# Patient Record
Sex: Male | Born: 2005 | Race: Black or African American | Hispanic: No | Marital: Single | Smoking: Never smoker
Health system: Southern US, Community
[De-identification: ages and names within clinical notes are randomized; demographics above are authoritative.]

## PROBLEM LIST (undated history)

## (undated) DIAGNOSIS — M419 Scoliosis, unspecified: Secondary | ICD-10-CM

## (undated) DIAGNOSIS — L309 Dermatitis, unspecified: Secondary | ICD-10-CM

## (undated) DIAGNOSIS — Z91018 Allergy to other foods: Secondary | ICD-10-CM

## (undated) DIAGNOSIS — F419 Anxiety disorder, unspecified: Secondary | ICD-10-CM

## (undated) DIAGNOSIS — F909 Attention-deficit hyperactivity disorder, unspecified type: Secondary | ICD-10-CM

## (undated) DIAGNOSIS — T783XXA Angioneurotic edema, initial encounter: Secondary | ICD-10-CM

## (undated) DIAGNOSIS — F840 Autistic disorder: Secondary | ICD-10-CM

## (undated) DIAGNOSIS — L509 Urticaria, unspecified: Secondary | ICD-10-CM

## (undated) DIAGNOSIS — T7840XA Allergy, unspecified, initial encounter: Secondary | ICD-10-CM

## (undated) DIAGNOSIS — J45909 Unspecified asthma, uncomplicated: Secondary | ICD-10-CM

## (undated) DIAGNOSIS — F429 Obsessive-compulsive disorder, unspecified: Secondary | ICD-10-CM

## (undated) HISTORY — PX: ADENOIDECTOMY: SUR15

## (undated) HISTORY — DX: Angioneurotic edema, initial encounter: T78.3XXA

## (undated) HISTORY — PX: SPINAL FUSION: SHX223

## (undated) HISTORY — DX: Urticaria, unspecified: L50.9

## (undated) HISTORY — DX: Anxiety disorder, unspecified: F41.9

## (undated) HISTORY — DX: Autistic disorder: F84.0

## (undated) HISTORY — DX: Allergy, unspecified, initial encounter: T78.40XA

## (undated) HISTORY — DX: Obsessive-compulsive disorder, unspecified: F42.9

## (undated) HISTORY — DX: Scoliosis, unspecified: M41.9

## (undated) HISTORY — PX: TYMPANOSTOMY TUBE PLACEMENT: SHX32

## (undated) HISTORY — PX: ADENOIDECTOMY: SHX5191

---
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
## 2024-02-06 ENCOUNTER — Other Ambulatory Visit: Payer: Self-pay

## 2024-02-06 ENCOUNTER — Ambulatory Visit (INDEPENDENT_AMBULATORY_CARE_PROVIDER_SITE_OTHER): Payer: MEDICAID | Admitting: Internal Medicine

## 2024-02-06 ENCOUNTER — Encounter: Payer: Self-pay | Admitting: Internal Medicine

## 2024-02-06 VITALS — BP 124/78 | HR 86 | Temp 98.5°F

## 2024-02-06 DIAGNOSIS — L2089 Other atopic dermatitis: Secondary | ICD-10-CM | POA: Diagnosis not present

## 2024-02-06 DIAGNOSIS — H1010 Acute atopic conjunctivitis, unspecified eye: Secondary | ICD-10-CM

## 2024-02-06 DIAGNOSIS — J4540 Moderate persistent asthma, uncomplicated: Secondary | ICD-10-CM

## 2024-02-06 DIAGNOSIS — T7800XD Anaphylactic reaction due to unspecified food, subsequent encounter: Secondary | ICD-10-CM

## 2024-02-06 DIAGNOSIS — J309 Allergic rhinitis, unspecified: Secondary | ICD-10-CM | POA: Diagnosis not present

## 2024-02-06 NOTE — Patient Instructions (Addendum)
 Mild intermittent asthma-- improved   Your spirometry showed inflammation today that is improved from prior     Daily controller medication(s): Continue Symbicort  160mcg 2 puffs twice a day with spacer. Rinse mouth out after. Reviewed proper technique. Spacer given - Prior to physical activity: albuterol  2 puffs 10-15 minutes before physical activity.  - Rescue medications: albuterol  4 puffs every 4-6 hours as needed - Asthma control goals:  * Full participation in all desired activities (may need albuterol  before activity) * Albuterol  use two time or less a week on average (not counting use with activity) * Cough interfering with sleep two time or less a month * Oral steroids no more than once a year * No hospitalizations  Food Allergy  -stable - Continue avoidance of peanut , tree nuts, fish, shellfish and all forms of eggs.  - Have access to Epipen  0.3mg  to be carried at all times.  - Emergency Action Plan given  Allergic rhinitis with conjunctivitis - improved  - Continue Xyzal  5mg  daily  - Continue Dymista  nasal spray 1 spray each nostril twice a day:  take every day to prevent symptoms   Atopic dermatitis-controlled during winter months, worse in summer - Continue daily moisturization with vaseline.   - Continue triamcinolone  for as needed use with flares  Return in about  5 months

## 2024-02-06 NOTE — Progress Notes (Addendum)
 FOLLOW UP Date of Service/Encounter:  02/06/24  Subjective:  Randy Lane (DOB: [birth year]) is a 18 y.o. male who [facility] in re-evaluation of the following: asthma, rhinitis, food allergy , eczema  History obtained from: chart review and patient and mother.  For Review, LV was on 12/26/23  with Dr. Lorin seen for routine follow-up. See below for summary of history and diagnostics.   Therapeutic plans/changes recommended: FEV1 2.25 L (65%), with 23% reversibility, started on Flovent  ; repeat sIgE: still positive to peanut , tree, nut, fish, shellfish, egg   Today presents for follow-up. Discussed the use of AI scribe software for clinical note transcription with the patient, who gave verbal consent to proceed.  History of Present Illness  Randy Lane is an 18 year old male with asthma who presents for follow-up of his respiratory symptoms.  Lower respiratory symptoms - Improvement in cough, wheeze, and shortness of breath - Adheres to Symbicort  regimen: two puffs twice daily, using approximately 26 to 27 doses out of 28 prescribed weekly - No recent need for rescue inhaler - Resumed physical activity with improved exercise tolerance  Upper respiratory symptoms - Persistent nasal congestion and rhinorrhea - Continues Xyzal  and Dymista  regularly for symptom control  Food allergies - Allergies to eggs, peanuts, tree nuts, and shellfish - Strict avoidance of allergenic foods - No recent accidental ingestions or allergic reactions   All medications reviewed by clinical staff and updated in chart. No new pertinent medical or surgical history except as noted in HPI.  ROS: All others negative except as noted per HPI.   Objective:  BP 124/78 (BP Location: Left Arm, Patient Position: Sitting, Cuff Size: Normal)   Pulse 86   Temp 98.5 F (36.9 C) (Temporal)   SpO2 98%  There is no height or weight on file to calculate  BMI. Physical Exam: General Appearance:  Alert, cooperative, no distress, appears stated age  Head:  Normocephalic, without obvious abnormality, atraumatic  Eyes:  Conjunctiva clear, EOM's intact  Ears EACs normal bilaterally  Nose: Nares normal, PINK edematous nasal mucosa with clear rhinorrhea , hypertrophic turbinates, no visible anterior polyps, and septum midline  Throat: Lips, tongue normal; teeth and gums normal, + cobblestoning  Neck: Supple, symmetrical  Lungs:   clear to auscultation bilaterally, Respirations unlabored, no coughing  Heart:  regular rate and rhythm and no murmur, Appears well perfused  Extremities: No edema  Skin: Skin color, texture, turgor normal and no rashes or lesions on visualized portions of skin  Neurologic: No gross deficits   Labs:  Lab Orders  No laboratory test(s) ordered today    Spirometry:  Tracings reviewed. His effort: Good reproducible efforts. FVC: 3.,27L FEV1: 1.97L, 57% predicted FEV1/FVC ratio: 60% Interpretation: moderately severe obstructive pattern . Improved  Please see scanned spirometry results for details.  Assessment/Plan   Patient Instructions  Mild intermittent asthma-- improved   Your spirometry showed inflammation today that is improved from prior     Daily controller medication(s): Continue Symbicort  160mcg 2 puffs twice a day with spacer. Rinse mouth out after. Reviewed proper technique. Spacer given - Prior to physical activity: albuterol  2 puffs 10-15 minutes before physical activity.  - Rescue medications: albuterol  4 puffs every 4-6 hours as needed - Asthma control goals:  * Full participation in all desired activities (may need albuterol  before activity) * Albuterol  use two time or less a week on average (not counting use with activity) * Cough  interfering with sleep two time or less a month * Oral steroids no more than once a year * No hospitalizations  Food Allergy  -stable - Continue avoidance of peanut ,  tree nuts, fish, shellfish and all forms of eggs.  - Have access to Epipen  0.3mg  to be carried at all times.  - Emergency Action Plan given  Allergic rhinitis with conjunctivitis - improved  - Continue Xyzal  5mg  daily  - Continue Dymista  nasal spray 1 spray each nostril twice a day:  take every day to prevent symptoms   Atopic dermatitis-controlled during winter months, worse in summer - Continue daily moisturization with vaseline.   - Continue triamcinolone  for as needed use with flares  Return in about  5 months   Other: reviewed spirometry technique and reviewed inhaler technique  Thank you so much for letting me partake in your care today.  Don't hesitate to reach out if you have any additional concerns!  Hargis Springer, MD  [facility] [place], [place]

## 2024-03-01 ENCOUNTER — Other Ambulatory Visit: Payer: Self-pay | Admitting: Family

## 2024-03-01 NOTE — Telephone Encounter (Signed)
 [place] [facility] called stating the patient is now in a group home and is needing the following medications to be sent in to the pharmacy Dymista ,Triamcinolone ,Levocetirizine,and his Ventolin  inhaler.

## 2024-03-04 ENCOUNTER — Telehealth: Payer: Self-pay

## 2024-03-04 MED ORDER — LEVOCETIRIZINE DIHYDROCHLORIDE 5 MG PO TABS: 5.0000 mg | ORAL_TABLET | Freq: Every evening | ORAL | 5 refills | Status: DC

## 2024-03-04 NOTE — Telephone Encounter (Signed)
 Mom called in stated a refill was needed for levocetirizine. Refill has been sent into [facility]. Verbalized understanding.

## 2024-07-29 ENCOUNTER — Other Ambulatory Visit: Payer: Self-pay | Admitting: Internal Medicine

## 2024-07-30 ENCOUNTER — Ambulatory Visit: Payer: MEDICAID | Admitting: Internal Medicine

## 2024-07-30 ENCOUNTER — Encounter: Payer: Self-pay | Admitting: Internal Medicine

## 2024-07-30 VITALS — BP 122/88 | HR 86 | Temp 98.1°F | Ht 66.0 in | Wt 187.2 lb

## 2024-07-30 DIAGNOSIS — H1010 Acute atopic conjunctivitis, unspecified eye: Secondary | ICD-10-CM | POA: Diagnosis not present

## 2024-07-30 DIAGNOSIS — J4540 Moderate persistent asthma, uncomplicated: Secondary | ICD-10-CM | POA: Diagnosis not present

## 2024-07-30 DIAGNOSIS — T7800XD Anaphylactic reaction due to unspecified food, subsequent encounter: Secondary | ICD-10-CM | POA: Diagnosis not present

## 2024-07-30 DIAGNOSIS — L2089 Other atopic dermatitis: Secondary | ICD-10-CM | POA: Diagnosis not present

## 2024-07-30 DIAGNOSIS — J309 Allergic rhinitis, unspecified: Secondary | ICD-10-CM

## 2024-07-30 MED ORDER — EPINEPHRINE 0.3 MG/0.3ML IJ SOAJ: 0.3000 mg | INTRAMUSCULAR | 2 refills | Status: AC | PRN

## 2024-07-30 MED ORDER — ALBUTEROL SULFATE HFA 108 (90 BASE) MCG/ACT IN AERS: 2.0000 | INHALATION_SPRAY | RESPIRATORY_TRACT | 1 refills | Status: DC | PRN

## 2024-07-30 NOTE — Progress Notes (Unsigned)
 "  FOLLOW UP Date of Service/Encounter:  07/30/2024  Subjective:  Randy Lane (DOB: 06-Mar-2006) is a 19 y.o. male who [facility]24 in re-evaluation of the following: asthma, rhinitis, food allergy , eczema  History obtained from: chart review and patient and mother.  For Review, LV was on 02/06/24   with Dr. Lorin seen for routine follow-up. See below for summary of history and diagnostics.   Therapeutic plans/changes recommended: FEV1: 1.97L, 57% predicted  Today presents for follow-up. Discussed the use of AI scribe software for clinical note transcription with the patient, who gave verbal consent to proceed.  History of Present Illness Randy Lane is a 19 year old male with food allergies who presents for follow-up on his allergy  management.  Food allergies - Allergies to peanut , tree nut, fish, shellfish, and egg - No recent accidental exposures or allergic reactions - Strict avoidance of known allergens  Egg allergy  and oral tolerance - Tolerates small amounts of baked egg (e.g., in brownies or cupcakes) without significant reactions - Within 30 minutes of consuming egg (not baked), experiences stomach discomfort; vomiting may occur within an hour, but not with baked egg - Experienced throat itching after consuming aioli believed to contain egg, without vomiting - Continues to avoid raw egg and egg cooked on the stove (hard boiled, fried, scrambled)  Asthma and respiratory symptoms - Uses Symbicort , two puffs twice daily, with approximately 65% adherence - No recent need for rescue inhaler, emergency room visits, or prednisone  use - Breathing is good without cough, wheeze, or shortness of breath  Allergic rhinitis - Takes Xyzal  (levocetirizine) for allergies - Uses Dymista  nasal spray regularly with good symptom control  Medication adherence and living situation - Recently moved to semi-independent living, resulting in  slight impact on medication adherence - Maintains a consistent routine with inhaler and nasal spray   All medications reviewed by clinical staff and updated in chart. No new pertinent medical or surgical history except as noted in HPI.  ROS: All others negative except as noted per HPI.   Objective:  BP 122/88   Pulse 86   Temp 98.1 F (36.7 C)   Ht 5' 6 (1.676 m)   Wt 187 lb 3.2 oz (84.9 kg)   SpO2 97%   BMI 30.21 kg/m  Body mass index is 30.21 kg/m. Physical Exam: General Appearance:  Alert, cooperative, no distress, appears stated age  Head:  Normocephalic, without obvious abnormality, atraumatic  Eyes:  Conjunctiva clear, EOM's intact  Ears EACs normal bilaterally  Nose: Nares normal, PINK edematous nasal mucosa with clear rhinorrhea , hypertrophic turbinates, no visible anterior polyps, and septum midline  Throat: Lips, tongue normal; teeth and gums normal, + cobblestoning  Neck: Supple, symmetrical  Lungs:   clear to auscultation bilaterally, Respirations unlabored, no coughing  Heart:  regular rate and rhythm and no murmur, Appears well perfused  Extremities: No edema  Skin: Skin color, texture, turgor normal and no rashes or lesions on visualized portions of skin  Neurologic: No gross deficits   Labs:  Lab Orders  No laboratory test(s) ordered today    Spirometry:  Tracings reviewed. His effort: Good reproducible efforts. FVC: 3.,27L FEV1: 1.97L, 57% predicted FEV1/FVC ratio: 60% Interpretation: moderately severe obstructive pattern . Improved  Please see scanned spirometry results for details.  Assessment/Plan   Patient Instructions  Mild intermittent asthma-- improved     Daily controller medication(s): Continue Symbicort  160mcg 2 puffs twice a day  with spacer. Rinse mouth out after. Reviewed proper technique. Spacer given - Prior to physical activity: albuterol  2 puffs 10-15 minutes before physical activity.  - Rescue medications: albuterol  4 puffs  every 4-6 hours as needed - Asthma control goals:  * Full participation in all desired activities (may need albuterol  before activity) * Albuterol  use two time or less a week on average (not counting use with activity) * Cough interfering with sleep two time or less a month * Oral steroids no more than once a year * No hospitalizations  Food Allergy  -stable - Continue avoidance of peanut , tree nuts, fish, shellfish and and straight egg   - okay to eat baked egg  - Have access to Epipen  0.3mg  to be carried at all times.  - Emergency Action Plan given  Allergic rhinitis with conjunctivitis - improved  - Continue Xyzal  5mg  daily  - Continue Dymista  nasal spray 1 spray each nostril twice a day:  take every day to prevent symptoms   Atopic dermatitis-controlled  - Continue daily moisturization with vaseline.   - Continue triamcinolone  for as needed use with flares  Return in about  5 months   Other: reviewed spirometry technique and reviewed inhaler technique  Thank you so much for letting me partake in your care today.  Don't hesitate to reach out if you have any additional concerns!  Hargis Springer, MD  [facility] , [place]        "

## 2024-07-30 NOTE — Patient Instructions (Addendum)
 Persistent asthma-- improved  Breathing tests are still abnormal, but I believe this is technique/effort versus true obstruction     Daily controller medication(s): Continue Symbicort  160mcg 2 puffs twice a day with spacer. Rinse mouth out after. Reviewed proper technique. Spacer given - Prior to physical activity: albuterol  2 puffs 10-15 minutes before physical activity.  - Rescue medications: albuterol  4 puffs every 4-6 hours as needed - Asthma control goals:  * Full participation in all desired activities (may need albuterol  before activity) * Albuterol  use two time or less a week on average (not counting use with activity) * Cough interfering with sleep two time or less a month * Oral steroids no more than once a year * No hospitalizations  Food Allergy  -stable - Continue avoidance of peanut , tree nuts, fish, shellfish and and straight egg   - okay to eat baked egg  - Have access to Epipen  0.3mg  to be carried at all times.  - Emergency Action Plan given  Allergic rhinitis with conjunctivitis - improved  - Continue Xyzal  5mg  daily  - Continue Dymista  nasal spray 1 spray each nostril twice a day:  take every day to prevent symptoms   Atopic dermatitis-controlled  - Continue daily moisturization with vaseline.   - Continue triamcinolone  for as needed use with flares  Return in about  5 months

## 2024-08-07 ENCOUNTER — Other Ambulatory Visit: Payer: Self-pay

## 2024-08-07 ENCOUNTER — Ambulatory Visit (HOSPITAL_COMMUNITY)
Admission: EM | Admit: 2024-08-07 | Discharge: 2024-08-08 | Disposition: A | Discharge status: Other Acute Inpatient Hospital | Payer: MEDICAID | Attending: Family Medicine | Admitting: Family Medicine

## 2024-08-07 DIAGNOSIS — R45851 Suicidal ideations: Secondary | ICD-10-CM | POA: Insufficient documentation

## 2024-08-07 DIAGNOSIS — R9431 Abnormal electrocardiogram [ECG] [EKG]: Secondary | ICD-10-CM | POA: Diagnosis not present

## 2024-08-07 DIAGNOSIS — F332 Major depressive disorder, recurrent severe without psychotic features: Secondary | ICD-10-CM | POA: Diagnosis not present

## 2024-08-07 LAB — COMPREHENSIVE METABOLIC PANEL WITH GFR
ALT: 25 U/L (ref 0–44)
AST: 22 U/L (ref 15–41)
Albumin: 4.7 g/dL (ref 3.5–5.0)
Alkaline Phosphatase: 113 U/L (ref 38–126)
Anion gap: 14 (ref 5–15)
BUN: 6 mg/dL (ref 6–20)
CO2: 26 mmol/L (ref 22–32)
Calcium: 9.7 mg/dL (ref 8.9–10.3)
Chloride: 103 mmol/L (ref 98–111)
Creatinine, Ser: 1.1 mg/dL (ref 0.61–1.24)
GFR, Estimated: >60 mL/min
Glucose, Bld: 107 mg/dL — ABNORMAL HIGH (ref 70–99)
Potassium: 3.6 mmol/L (ref 3.5–5.1)
Sodium: 143 mmol/L (ref 135–145)
Total Bilirubin: 0.6 mg/dL (ref 0.0–1.2)
Total Protein: 7.9 g/dL (ref 6.5–8.1)

## 2024-08-07 LAB — CBC WITH DIFFERENTIAL/PLATELET
Abs Immature Granulocytes: 0.07 K/uL (ref 0.00–0.07)
Basophils Absolute: 0.1 K/uL (ref 0.0–0.1)
Basophils Relative: 1 %
Eosinophils Absolute: 0.3 K/uL (ref 0.0–0.5)
Eosinophils Relative: 4 %
HCT: 43.5 % (ref 39.0–52.0)
Hemoglobin: 14.2 g/dL (ref 13.0–17.0)
Immature Granulocytes: 1 %
Lymphocytes Relative: 30 %
Lymphs Abs: 2.6 K/uL (ref 0.7–4.0)
MCH: 30.3 pg (ref 26.0–34.0)
MCHC: 32.6 g/dL (ref 30.0–36.0)
MCV: 92.8 fL (ref 80.0–100.0)
Monocytes Absolute: 0.6 K/uL (ref 0.1–1.0)
Monocytes Relative: 6 %
Neutro Abs: 5.3 K/uL (ref 1.7–7.7)
Neutrophils Relative %: 58 %
Platelets: 444 K/uL — ABNORMAL HIGH (ref 150–400)
RBC: 4.69 MIL/uL (ref 4.22–5.81)
RDW: 12.2 % (ref 11.5–15.5)
WBC: 8.9 K/uL (ref 4.0–10.5)
nRBC: 0 % (ref 0.0–0.2)

## 2024-08-07 LAB — URINALYSIS, ROUTINE W REFLEX MICROSCOPIC
Bilirubin Urine: NEGATIVE
Glucose, UA: 50 mg/dL — AB
Hgb urine dipstick: NEGATIVE
Ketones, ur: NEGATIVE mg/dL
Leukocytes,Ua: NEGATIVE
Nitrite: NEGATIVE
Protein, ur: NEGATIVE mg/dL
Specific Gravity, Urine: 1.016 (ref 1.005–1.030)
pH: 7 (ref 5.0–8.0)

## 2024-08-07 LAB — POCT URINE DRUG SCREEN - MANUAL ENTRY (I-SCREEN)
POC Amphetamine UR: NOT DETECTED
POC Buprenorphine (BUP): NOT DETECTED
POC Cocaine UR: NOT DETECTED
POC Marijuana UR: NOT DETECTED
POC Methadone UR: NOT DETECTED
POC Methamphetamine UR: NOT DETECTED
POC Morphine: NOT DETECTED
POC Oxazepam (BZO): NOT DETECTED
POC Oxycodone UR: NOT DETECTED
POC Secobarbital (BAR): NOT DETECTED

## 2024-08-07 LAB — ETHANOL: Alcohol, Ethyl (B): <15 mg/dL

## 2024-08-07 LAB — SALICYLATE LEVEL: Salicylate Lvl: <7 mg/dL — ABNORMAL LOW (ref 7.0–30.0)

## 2024-08-07 LAB — TSH: TSH: 0.876 u[IU]/mL (ref 0.350–4.500)

## 2024-08-07 LAB — VITAMIN D 25 HYDROXY (VIT D DEFICIENCY, FRACTURES): Vit D, 25-Hydroxy: 12.7 ng/mL — ABNORMAL LOW (ref 30–100)

## 2024-08-07 LAB — ACETAMINOPHEN LEVEL: Acetaminophen (Tylenol), Serum: <10 ug/mL — ABNORMAL LOW (ref 10–30)

## 2024-08-07 MED ORDER — LORAZEPAM 2 MG/ML IJ SOLN: 2.0000 mg | Freq: Three times a day (TID) | INTRAMUSCULAR | Status: DC | PRN

## 2024-08-07 MED ORDER — CLONIDINE HCL 0.1 MG PO TABS
0.1000 mg | ORAL_TABLET | Freq: Two times a day (BID) | ORAL | Status: DC
  Administered 2024-08-07 – 2024-08-08 (×2): 0.1 mg via ORAL
  Filled 2024-08-07 (×2): qty 1

## 2024-08-07 MED ORDER — ACETAMINOPHEN 325 MG PO TABS: 650.0000 mg | ORAL_TABLET | Freq: Four times a day (QID) | ORAL | Status: DC | PRN

## 2024-08-07 MED ORDER — HALOPERIDOL LACTATE 5 MG/ML IJ SOLN: 5.0000 mg | Freq: Three times a day (TID) | INTRAMUSCULAR | Status: DC | PRN

## 2024-08-07 MED ORDER — DIPHENHYDRAMINE HCL 50 MG/ML IJ SOLN: 50.0000 mg | Freq: Three times a day (TID) | INTRAMUSCULAR | Status: DC | PRN

## 2024-08-07 MED ORDER — MAGNESIUM HYDROXIDE 400 MG/5ML PO SUSP: 30.0000 mL | Freq: Every day | ORAL | Status: DC | PRN

## 2024-08-07 MED ORDER — DIPHENHYDRAMINE HCL 50 MG PO CAPS: 50.0000 mg | ORAL_CAPSULE | Freq: Three times a day (TID) | ORAL | Status: DC | PRN

## 2024-08-07 MED ORDER — HALOPERIDOL 5 MG PO TABS: 5.0000 mg | ORAL_TABLET | Freq: Three times a day (TID) | ORAL | Status: DC | PRN

## 2024-08-07 MED ORDER — ALUM & MAG HYDROXIDE-SIMETH 200-200-20 MG/5ML PO SUSP: 30.0000 mL | ORAL | Status: DC | PRN

## 2024-08-07 MED ORDER — FLUVOXAMINE MALEATE 50 MG PO TABS
150.0000 mg | ORAL_TABLET | Freq: Two times a day (BID) | ORAL | Status: DC
  Administered 2024-08-07 – 2024-08-08 (×2): 150 mg via ORAL
  Filled 2024-08-07 (×3): qty 3

## 2024-08-07 MED ORDER — HALOPERIDOL LACTATE 5 MG/ML IJ SOLN: 10.0000 mg | Freq: Three times a day (TID) | INTRAMUSCULAR | Status: DC | PRN

## 2024-08-07 MED ORDER — DEXMETHYLPHENIDATE HCL ER 5 MG PO CP24
25.0000 mg | ORAL_CAPSULE | Freq: Every day | ORAL | Status: DC
  Administered 2024-08-08: 25 mg via ORAL
  Filled 2024-08-07: qty 5

## 2024-08-07 NOTE — BH Assessment (Addendum)
 Comprehensive Clinical Assessment (CCA) Note  08/08/2024 Randy Lane [id number]  Chief Complaint:  Chief Complaint  Patient presents with   Suicidal Thoughts    Visit Diagnosis:   Severe episode of recurrent major depressive disorder, without psychotic features (HCC)  Suicidal ideation   Disposition: Per Randy Culver, NP patient is recommended for inpatient admission.   Disposition SW to pursue appropriate inpatient options.  The patient demonstrates the following risk factors for suicide: Chronic risk factors for suicide include: previous suicide attempts attempted to drown self in bathtub and attempt to walk into traffic within the last week and previous self-harm via cutting. Acute risk factors for suicide include: Break up from relationship. Protective factors for this patient include: positive social support and hope for the future. Considering these factors, the overall suicide risk at this point appears to be moderate. Patient is not appropriate for outpatient follow up.   Patient is a 19 year old male with a history of anxiety disorder who presents voluntarily to [facility] for an assessment. Patient resides in a Group home and identifies his mother and his ex's mother as their primary support system.Patient reports isolation, crying spells, irritability, hopelessness, guilt, loss of interest to do things they enjoy, fatigue, lack of concentration, and worthlessness. Patient reports history of past suicide attempts, last occurrence was earlier this week.  Patient does not report a hx of Substance Abuse:  Patient reports NSSIB, SI, and denied HI, AVH.  Patient identifies his primary stressors as relationship. Patient denies history of abuse or trauma. Patient denies current legal problems. Patient is not receiving outpatient therapy, however does receive psychiatry services, [facility]. Patient reports he  takes his medications as  prescribed (see MAR) and denies recent medication changes. Patient reports previous inpatient admission at [facility] for suicidal ideation in April.  Patient denies access to weapons.   Pt is dressed in scrubs, alert, oriented x 5 with normal speech and restless motor behavior. Eye contact is fair . Pts mood is depressed and affect is anxious. Thought process is coherent and relevant. Pts insight is good and judgement is fair. There is no indication Pt is currently responding to internal stimuli or experiencing delusional thought content. Pt was cooperative throughout assessment.   Patient is unable to contract for safety outside of the hospital.    Treatment options were discussed and patient is in agreement with recommendation for Inpatient Hospitalization.    CCA Screening, Triage and Referral (STR)  Patient Reported Information How did you hear about us ? Legal System  What Is the Reason for Your Visit/Call Today? Randy Lane is a 19 year old male brought in by [employer] to the [facility] and is voluntary and unaccompanied. Pt endorsing SI. Reports having 2 recent suicide attempts this week. Pt reports attempting to drown self in the bathtub as well as walking to the railroad tracks with intent to run in front of a train. Pt denied AVH. Pt reports recently going through a breakup which has caused him to feel depressed. Pt acknowledges feelings of irritability, decreased energy, difficulty concentrating, hopelessness, worthlessness, blame/guilt. Pt HI, history of violence and access to weapons. Pt also denied alcohol/substance use. Pt reports hx of GAD, ADD and Autism.  How Long Has This Been Causing You Problems? 1 wk - 1 month  What Do You Feel Would Help You the Most Today? Treatment for Depression or other mood problem; Stress Management; Medication(s); Social Support   Have You Recently Had  Any Thoughts About Hurting Yourself? Yes  Are You Planning to Commit Suicide/Harm Yourself At This time?  No   Flowsheet Row ED from 08/07/2024 in [facility] Most recent reading at 08/07/2024  8:20 PM ED from 11/08/2023 in [facility] Most recent reading at 11/08/2023 12:22 PM ED from 11/08/2023 in [facility] Most recent reading at 11/08/2023  2:29 AM  C-SSRS RISK CATEGORY Moderate Risk Low Risk Low Risk    Have you Recently Had Thoughts About Hurting Someone Randy Lane? No  Are You Planning to Harm Someone at This Time? No  Explanation: Pt endorsing SI, denied HI.   Have You Used Any Alcohol or Drugs in the Past 24 Hours? No  How Long Ago Did You Use Drugs or Alcohol? None reported.  What Did You Use and How Much? None reported.   Do You Currently Have a Therapist/Psychiatrist? Yes  Name of Therapist/Psychiatrist: Name of Therapist/Psychiatrist: Pt [facility]. Pt does not have a therapist.   Have You Been Recently Discharged From Any Office Practice or Programs? No  Explanation of Discharge From Practice/Program: No data recorded    CCA Screening Triage Referral Assessment Type of Contact: Face-to-Face  Telemedicine Service Delivery:   Is this Initial or Reassessment?   Date Telepsych consult ordered in [facility]:    Time Telepsych consult ordered in [facility]:    Location of Assessment: [facility] Services  Provider Location: [facility] Assessment Services   Collateral Involvement: None.   Does Patient Have a Automotive Engineer Guardian? No  Legal Guardian Contact Information: n/a  Copy of Legal Guardianship Form: -- (none)  Legal Guardian Notified of Arrival: -- (n/a)  Legal Guardian Notified of Pending Discharge: -- (n/a)  If Minor and Not Living with Parent(s), Who has Custody? n/a  Is CPS involved or ever been involved? Never  Is APS involved or ever been involved? Never   Patient Determined To Be At Risk for Harm To  Self or Others Based on Review of Patient Reported Information or Presenting Complaint? Yes, for Self-Harm  Method: No Plan  Availability of Means: No access or NA  Intent: Vague intent or NA  Notification Required: No need or identified person  Additional Information for Danger to Others Potential: -- (n/a)  Additional Comments for Danger to Others Potential: NA  Are There Guns or Other Weapons in Your Home? No  Types of Guns/Weapons: Pt denies.  Are These Weapons Safely Secured?                            -- (pt denied)  Who Could Verify You Are Able To Have These Secured: NA  Do You Have any Outstanding Charges, Pending Court Dates, Parole/Probation? pt denied legal involvement.  Contacted To Inform of Risk of Harm To Self or Others: -- (n/a)    Does Patient Present under Involuntary Commitment? No    [place] of Residence: [name]   Patient Currently Receiving the Following Services: Medication Management   Determination of Need: Urgent (48 hours)   Options For Referral: Inpatient Hospitalization; Intensive Outpatient Therapy; Outpatient Therapy     CCA Biopsychosocial Patient Reported Schizophrenia/Schizoaffective Diagnosis in Past: No   Strengths: Pt is insightful.   Mental Health Symptoms Depression:  Difficulty Concentrating; Hopelessness; Worthlessness; Irritability; Sleep (too much or little); Tearfulness; Fatigue   Duration of Depressive  symptoms: Duration of Depressive Symptoms: Greater than two weeks   Mania:  None   Anxiety:   Worrying; Restlessness; Irritability; Difficulty concentrating; Fatigue   Psychosis:  None   Duration of Psychotic symptoms:    Trauma:  None   Obsessions:  None   Compulsions:  None   Inattention:  None   Hyperactivity/Impulsivity:  Feeling of restlessness   Oppositional/Defiant Behaviors:  None   Emotional Irregularity:  Recurrent suicidal behaviors/gestures/threats   Other Mood/Personality Symptoms:   NA    Mental Status Exam Appearance and self-care  Stature:  Average   Weight:  Average weight   Clothing:  Casual   Grooming:  Normal   Cosmetic use:  None   Posture/gait:  Normal   Motor activity:  Not Remarkable   Sensorium  Attention:  Normal   Concentration:  Normal   Orientation:  X5   Recall/memory:  Defective in Immediate   Affect and Mood  Affect:  Congruent   Mood:  Depressed   Relating  Eye contact:  Normal   Facial expression:  Depressed   Attitude toward examiner:  Cooperative   Thought and Language  Speech flow: Normal   Thought content:  Appropriate to Mood and Circumstances   Preoccupation:  None   Hallucinations:  None   Organization:  Coherent; Goal-directed; Intact   [employer] of Knowledge:  Fair   Intelligence:  Average   Abstraction:  Normal   Judgement:  Fair   Dance Movement Psychotherapist:  Adequate   Insight:  Fair   Decision Making:  Normal   Social Functioning  Social Maturity:  Impulsive   Social Judgement:  Normal   Stress  Stressors:  Relationship   Coping Ability:  Overwhelmed; Exhausted   Skill Deficits:  None   Supports:  Family     Religion: Religion/Spirituality Are You A Religious Person?: No How Might This Affect Treatment?: NA  Leisure/Recreation:    Exercise/Diet: Exercise/Diet Do You Exercise?: Yes What Type of Exercise Do You Do?: Run/Walk How Many Times a Week Do You Exercise?: 1-3 times a week Have You Gained or Lost A Significant Amount of Weight in the Past Six Months?: No Do You Follow a Special Diet?: Yes Type of Diet: Pt reports he's allergic to peanut , shellfish, fish and eggs. Do You Have Any Trouble Sleeping?: No   CCA Employment/Education Employment/Work Situation: Employment / Work Situation Employment Situation: Employed Work Stressors: None reported. Patient's Job has Been Impacted by Current Illness: No Has Patient ever Been in the [employer]?:  No  Education: Education Is Patient Currently Attending School?: No Last Grade Completed: 12 Did You Attend College?: No Did You Have An Individualized Education Program (IIEP): No Did You Have Any Difficulty At School?: No Patient's Education Has Been Impacted by Current Illness: No   CCA Family/Childhood History Family and Relationship History:    Childhood History:  Childhood History By whom was/is the patient raised?: Mother Did patient suffer any verbal/emotional/physical/sexual abuse as a child?: No Did patient suffer from severe childhood neglect?: No Has patient ever been sexually abused/assaulted/raped as an adolescent or adult?: No Was the patient ever a victim of a crime or a disaster?: No Witnessed domestic violence?: No Has patient been affected by domestic violence as an adult?: No       CCA Substance Use Alcohol/Drug Use: Alcohol / Drug Use Pain Medications: See MAR Prescriptions: See MAR Over the Counter: See MAR History of alcohol / drug use?: No history of alcohol /  drug abuse Longest period of sobriety (when/how long): NA Negative Consequences of Use:  (n/a) Withdrawal Symptoms: None                         ASAM's:  Six Dimensions of Multidimensional Assessment  Dimension 1:  Acute Intoxication and/or Withdrawal Potential:      Dimension 2:  Biomedical Conditions and Complications:      Dimension 3:  Emotional, Behavioral, or Cognitive Conditions and Complications:     Dimension 4:  Readiness to Change:     Dimension 5:  Relapse, Continued use, or Continued Problem Potential:     Dimension 6:  Recovery/Living Environment:     ASAM Severity Score:    ASAM Recommended Level of Treatment:     Substance use Disorder (SUD)    Recommendations for Services/Supports/Treatments:    Disposition Recommendation per psychiatric provider: We recommend inpatient psychiatric hospitalization when medically cleared. Patient is under voluntary  admission status at this time; please IVC if attempts to leave hospital.   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

## 2024-08-07 NOTE — Progress Notes (Signed)
" °   08/07/24 1658  [facility] Triage Screening (Walk-ins at [facility] only)  How Did You Hear About Us ? Legal System  What Is the Reason for Your Visit/Call Today? Randy Lane is a 19 year old male presenting to [facility] escorted by [employer]. PT states he recently experienced a break up and it endorsed suicidal thoughts and the works of a possible plan to end his life. Pt states he had two recent attempts of ending his life by trying to drown himself and stand infront of a railroad track within the past week. Pt states he also cut his arm a week ago. Pt has thought of 4 suicide plans in the past few years. Pt states he currently does not have a plan to end his life, but has severe suicidal thoughts. PT states he has Autism, MDD and GAD. He is currently taking Prozac  and Sertraline as prescribed. Pt denies seeing a therapist at this time. Pt also denies substance use, Hi, NSSIB and AVH at this time.  How Long Has This Been Causing You Problems? <Week  Have You Recently Had Any Thoughts About Hurting Yourself? Yes  How long ago did you have thoughts about hurting yourself? today  Are You Planning to Commit Suicide/Harm Yourself At This time? No  Have you Recently Had Thoughts About Hurting Someone [name]? No  Are You Planning To Harm Someone At This Time? No  Exploitation of patient/patient's resources Denies  Self-Neglect Denies  Possible abuse reported to: Other (Comment)  Are you currently experiencing any auditory, visual or other hallucinations? No  Have You Used Any Alcohol or Drugs in the Past 24 Hours? No  Do you have any current medical co-morbidities that require immediate attention? No  What Do You Feel Would Help You the Most Today? Treatment for Depression or other mood problem;Stress Management  If access to [facility] was not available, would you have sought care in the Emergency Department? No  Determination of Need Urgent (48 hours)  Options For Referral Inpatient Hospitalization;Intensive Outpatient  Therapy  Determination of Need filed? Yes    "

## 2024-08-07 NOTE — ED Provider Notes (Signed)
 [facility] Urgent Care Continuous Assessment Admission H&P  Date: 08/07/24 Patient Name: Randy Lane MRN: [id number] Chief Complaint: Break up   Diagnoses:  Final diagnoses:  Severe episode of recurrent major depressive disorder, without psychotic features (HCC)  Suicidal ideation    HPI: Per triage, Adkison is a 19 year old male presenting to [facility] escorted by [employer]. PT states he recently experienced a break up and it endorsed suicidal thoughts and the works of a possible plan to end his life. Pt states he had two recent attempts of ending his life by trying to drown himself and stand infront of a railroad track within the past week. Pt states he also cut his arm a week ago. Pt has thought of 4 suicide plans in the past few years. Pt states he currently does not have a plan to end his life, but has severe suicidal thoughts. PT states he has Autism, MDD and GAD. He is currently taking Prozac  and Sertraline as prescribed. Pt denies seeing a therapist at this time. Pt also denies substance use, Hi, NSSIB and AVH at this time.   Pt is seen face-to-face on the [facility] Adult treatment area. Pt is alert & oriented x 4 and engages in today's visit. Pt is seated in a chair at the table, holding his head. Today, he states he came in for evaluation due to a break up. States he and his partner of 3 months broke up 2-3 weeks ago. States breakup was not mutual and was initiated by partner and has experienced significant distress following the breakup. He reports feeling suicidal since the breakup occurred. He describes multiple recents suicidal plans. He reports that one week ago (last Saturday), he attempted to drown himself in a bathtub, states earlier this week he walked to the train tracks with the intention of jumping in front of a train but somebody talked me out of it. He states today he called the police from his group home because I really wanted them to shoot me. He states he called out of work today which  the first time since the break up. States he is currently residing in a [facility] group home and has been there a few months; states this is going alright. States appetite and sleep are pretty good. He denies homicidal ideation, intent or plan. Denies AVH. The patient presents with symptoms consistent with depressive episode, including persistent low mood, anhedonia, fatigue, impaired concentration, and significant feelings of hopelessness. The patient endorses suicidal ideation and voices different plans to end his life. He asks is there a shot that I can take that will kill me? Inpatient hospitalization is recommended at this time.   Pt gave verbal consent to contact his mother to obtain phone number for group home and to advise group home that he would not be at the group home this evening. [facility] [phone] advised that patient would not be at the group home this evening due to hospitalization.   Total Time spent with patient: 20 minutes  Musculoskeletal  Strength & Muscle Tone: within normal limits Gait & Station: normal Patient leans: N/A  Psychiatric Specialty Exam  Presentation General Appearance:  Fairly Groomed; Appropriate for Environment  Eye Contact: Poor  Speech: Clear and Coherent; Slow  Speech Volume: Decreased  Handedness: Right   Mood and Affect  Mood: Hopeless; Depressed  Affect: Depressed; Congruent   Thought Process  Thought Processes: Goal Directed  Descriptions of Associations:Intact  Orientation:Full (Time, Place and Person)  Thought  Content:Logical  Diagnosis of Schizophrenia or Schizoaffective disorder in past: No   Hallucinations:Hallucinations: None  Ideas of Reference:None  Suicidal Thoughts:Suicidal Thoughts: Yes, Active SI Active Intent and/or Plan: With Plan  Homicidal Thoughts:Homicidal Thoughts: No   Sensorium  Memory: Immediate Good; Recent Good  Judgment: Poor  Insight: Poor   Executive  Functions  Concentration: Fair  Attention Span: Fair  Recall: Good  Fund of Knowledge: Good  Language: Good   Psychomotor Activity  Psychomotor Activity:Psychomotor Activity: Decreased   Assets  Assets: Communication Skills; Desire for Improvement; Housing; Vocational/Educational   Sleep  Sleep:Sleep: Fair   Nutritional Assessment (For OBS and FBC admissions only) Has the patient had a weight loss or gain of 10 pounds or more in the last 3 months?: No Has the patient had a decrease in food intake/or appetite?: No Does the patient have dental problems?: No Does the patient have eating habits or behaviors that may be indicators of an eating disorder including binging or inducing vomiting?: No Has the patient recently lost weight without trying?: 0 Has the patient been eating poorly because of a decreased appetite?: 0 Malnutrition Screening Tool Score: 0    Physical Exam Vitals and nursing note reviewed.  HENT:     Head: Normocephalic.     Mouth/Throat:     Mouth: Mucous membranes are moist.  Cardiovascular:     Rate and Rhythm: Normal rate.  Pulmonary:     Effort: Pulmonary effort is normal.  Skin:    General: Skin is warm and dry.  Neurological:     Mental Status: He is alert and oriented to person, place, and time.  Psychiatric:     Comments: See HPI    Review of Systems  Constitutional:  Negative for chills and fever.  HENT:  Negative for congestion and sore throat.   Respiratory:  Negative for cough and shortness of breath.   Cardiovascular:  Negative for chest pain and palpitations.  Gastrointestinal:  Negative for diarrhea, nausea and vomiting.  Psychiatric/Behavioral:  Positive for depression and suicidal ideas. Negative for hallucinations and substance abuse.     Blood pressure (!) 138/118, pulse 72, temperature 97.8 F (36.6 C), temperature source Oral, resp. rate 19, SpO2 99%. There is no height or weight on file to calculate BMI.  Past  Psychiatric History: Autism, ADHD, Depression, Anxiety Hospitalizations: [facility] (Apr, 2025), [facility] (March, 2023, Nov, 2022  Is the patient at risk to self? Yes  Has the patient been a risk to self in the past 6 months? Yes .    Has the patient been a risk to self within the distant past? Yes  - self-inflicted lacerations, suicidal ideation and intentional self-harm in 2023 Is the patient a risk to others? No   Has the patient been a risk to others in the past 6 months? unknown Has the patient been a risk to others within the distant past? unknown  Past Medical History: Asthma, Food allergy , Scoliosis, Urticaria   Family History: Maternal cousin: ADD  Social History: High school graduate. Single, lives in group home [facility] [address]) for the past few months. 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 Denies substance use.   Last Labs:  No visits with results within 6 Month(s) from this visit.  Latest known visit with results is:  Admission on 11/08/2023, Discharged on 11/12/2023  Component Date Value Ref Range Status   HCV Ab 11/10/2023 NON REACTIVE  NON REACTIVE Final   Comment: (NOTE) Nonreactive HCV  antibody screen is consistent with no HCV infections,  unless recent infection is suspected or other evidence exists to indicate HCV infection.  Performed at [facility], [address], [place], [place] [address]    Magnesium  11/10/2023 2.3  1.7 - 2.4 mg/dL Final   Performed at [facility], [address], [place], [place] [address]   Vit D, 25-Hydroxy 11/10/2023 13.59 (L)  30 - 100 ng/mL Final   Comment: (NOTE) Vitamin D  deficiency has been defined by the Institute of Medicine  and an Endocrine Society practice guideline as a level of serum 25-OH  vitamin D  less than 20 ng/mL (1,2). The Endocrine Society went on to  further define vitamin D  insufficiency as a level between 21 and 29  ng/mL (2).  1. IOM (Institute of Medicine). 2010. Dietary  reference intakes for  calcium and D. [place]: [employer]. 2. Holick MF, [place], Bischoff-Ferrari HA, et al. Evaluation,  treatment, and prevention of vitamin D  deficiency: an Endocrine  Society clinical practice guideline, JCEM. 2011 Jul; 96(7): 1911-30.  Performed at [facility], [address], [place], [place] [address]    Vitamin B-12 11/10/2023 321  180 - 914 pg/mL Final   Comment: (NOTE) This assay is not validated for testing neonatal or myeloproliferative syndrome specimens for Vitamin B12 levels. Performed at [facility], [address] [place], [place] [address]    RPR Ser Ql 11/10/2023 NON REACTIVE  NON REACTIVE Final   Performed at [facility], [address], [place], [place] [address]   HIV Screen 4th Generation wRfx 11/10/2023 Non Reactive  Non Reactive Final   Performed at [facility], [address], [place], [place] [address]    Allergies: Peanut  oil, Fish allergy , Dust mite extract, Egg protein (egg white), Egg protein-containing drug products, Other, and Shellfish allergy   Medications:  Facility Ordered Medications  Medication   acetaminophen  (TYLENOL ) tablet 650 mg   alum & mag hydroxide-simeth (MAALOX/MYLANTA) 200-200-20 MG/5ML suspension 30 mL   magnesium  hydroxide (MILK OF MAGNESIA) suspension 30 mL   haloperidol  (HALDOL ) tablet 5 mg   And   diphenhydrAMINE  (BENADRYL ) capsule 50 mg   haloperidol  lactate (HALDOL ) injection 5 mg   And   diphenhydrAMINE  (BENADRYL ) injection 50 mg   And   LORazepam  (ATIVAN ) injection 2 mg   haloperidol  lactate (HALDOL ) injection 10 mg   And   diphenhydrAMINE  (BENADRYL ) injection 50 mg   And   LORazepam  (ATIVAN ) injection 2 mg   cloNIDine  (CATAPRES ) tablet 0.1 mg   [START ON 08/08/2024] dexmethylphenidate  (FOCALIN  XR) 24 hr capsule 25 mg   fluvoxaMINE  (LUVOX ) tablet 150 mg   PTA Medications  Medication Sig   dexmethylphenidate  40 MG CP24 Take 1 capsule (40 mg  total) by mouth daily after breakfast.   fluvoxaMINE  (LUVOX ) 50 MG tablet Take 3 tablets (150 mg total) by mouth 2 (two) times daily.   hydrOXYzine  (ATARAX ) 25 MG tablet Take 1 tablet (25 mg total) by mouth 3 (three) times daily as needed for anxiety.   mirtazapine  (REMERON ) 7.5 MG tablet Take 1 tablet (7.5 mg total) by mouth at bedtime as needed (sleep).   melatonin 5 MG TABS Take 1 tablet (5 mg total) by mouth at bedtime.   azelastine  (ASTELIN ) 0.1 % nasal spray Place 1 spray into both nostrils 2 (two) times daily. Use in each nostril as directed   loratadine  (CLARITIN ) 10 MG tablet Take 1  tablet (10 mg total) by mouth every evening.   budesonide -formoterol  (SYMBICORT ) 160-4.5 MCG/ACT inhaler Inhale 2 puffs into the lungs 2 (two) times daily.   cloNIDine  (CATAPRES ) 0.1 MG tablet Take 0.1 mg by mouth 2 (two) times daily.   levocetirizine (XYZAL ) 5 MG tablet TAKE ONE TABLET BY MOUTH EVERY EVENING   Azelastine -Fluticasone  137-50 MCG/ACT SUSP Place into the nose.   albuterol  (VENTOLIN  HFA) 108 (90 Base) MCG/ACT inhaler Inhale 2 puffs into the lungs every 4 (four) hours as needed for wheezing or shortness of breath.   EPINEPHrine  0.3 mg/0.3 mL IJ SOAJ injection Inject 0.3 mg into the muscle as needed for anaphylaxis.      Medical Decision Making   Lab Orders         CBC with Differential/Platelet         Comprehensive metabolic panel         Ethanol         TSH         Urinalysis, Routine w reflex microscopic -Urine, Clean Catch         VITAMIN D  25 Hydroxy (Vit-D Deficiency, Fractures)         POCT Urine Drug Screen - (I-Screen)     EKG  Agitation protocol  Home medications continued: Clonidine  0.1 mg PO BID Dexmethylphenidate  ER 25 mg PO daily Fluvoxamine  150 mg PO BID   Recommendations  Based on my evaluation the patient does not appear to have an emergency medical condition.  Inpatient hospitalization is recommended.   Sherrell Culver, PMHNP-BC, FNP-BC  08/07/24  6:50 PM

## 2024-08-07 NOTE — ED Notes (Signed)
 Pt denies SI/HI/AVH. No reports of pain. Compliant with scheduled meds. He was calm, cooperative, depressed, and pleasant. No behavioral concerns at this time. Pt sleeping in bed, no acute distress noted. Respirations even and unlabored. Continue to monitor for safety.

## 2024-08-08 ENCOUNTER — Other Ambulatory Visit: Payer: Self-pay

## 2024-08-08 ENCOUNTER — Encounter (HOSPITAL_COMMUNITY): Payer: Self-pay | Admitting: Family Medicine

## 2024-08-08 ENCOUNTER — Inpatient Hospital Stay (HOSPITAL_COMMUNITY)
Admission: AD | Admit: 2024-08-08 | Discharge: 2024-08-13 | DRG: 885 | Disposition: A | Discharge status: Home / Self Care | Payer: MEDICAID | Source: Intra-hospital

## 2024-08-08 DIAGNOSIS — R45851 Suicidal ideations: Secondary | ICD-10-CM | POA: Diagnosis not present

## 2024-08-08 DIAGNOSIS — J45909 Unspecified asthma, uncomplicated: Secondary | ICD-10-CM | POA: Diagnosis present

## 2024-08-08 DIAGNOSIS — Z981 Arthrodesis status: Secondary | ICD-10-CM

## 2024-08-08 DIAGNOSIS — Z79899 Other long term (current) drug therapy: Secondary | ICD-10-CM

## 2024-08-08 DIAGNOSIS — Z7951 Long term (current) use of inhaled steroids: Secondary | ICD-10-CM

## 2024-08-08 DIAGNOSIS — E559 Vitamin D deficiency, unspecified: Secondary | ICD-10-CM | POA: Diagnosis present

## 2024-08-08 DIAGNOSIS — Z8249 Family history of ischemic heart disease and other diseases of the circulatory system: Secondary | ICD-10-CM

## 2024-08-08 DIAGNOSIS — F332 Major depressive disorder, recurrent severe without psychotic features: Principal | ICD-10-CM | POA: Diagnosis present

## 2024-08-08 DIAGNOSIS — Z823 Family history of stroke: Secondary | ICD-10-CM | POA: Diagnosis not present

## 2024-08-08 DIAGNOSIS — Z9151 Personal history of suicidal behavior: Secondary | ICD-10-CM

## 2024-08-08 DIAGNOSIS — F909 Attention-deficit hyperactivity disorder, unspecified type: Secondary | ICD-10-CM | POA: Diagnosis present

## 2024-08-08 DIAGNOSIS — G4700 Insomnia, unspecified: Secondary | ICD-10-CM | POA: Diagnosis present

## 2024-08-08 DIAGNOSIS — Z833 Family history of diabetes mellitus: Secondary | ICD-10-CM

## 2024-08-08 DIAGNOSIS — F411 Generalized anxiety disorder: Secondary | ICD-10-CM | POA: Diagnosis present

## 2024-08-08 DIAGNOSIS — F429 Obsessive-compulsive disorder, unspecified: Secondary | ICD-10-CM | POA: Diagnosis present

## 2024-08-08 MED ORDER — MAGNESIUM HYDROXIDE 400 MG/5ML PO SUSP: 30.0000 mL | Freq: Every day | ORAL | Status: DC | PRN

## 2024-08-08 MED ORDER — FLUVOXAMINE MALEATE 50 MG PO TABS
150.0000 mg | ORAL_TABLET | Freq: Two times a day (BID) | ORAL | Status: DC
  Administered 2024-08-08 – 2024-08-13 (×10): 150 mg via ORAL
  Filled 2024-08-08 (×10): qty 3

## 2024-08-08 MED ORDER — HALOPERIDOL LACTATE 5 MG/ML IJ SOLN: 5.0000 mg | Freq: Three times a day (TID) | INTRAMUSCULAR | Status: DC | PRN

## 2024-08-08 MED ORDER — DIPHENHYDRAMINE HCL 25 MG PO CAPS
50.0000 mg | ORAL_CAPSULE | Freq: Three times a day (TID) | ORAL | Status: DC | PRN
  Administered 2024-08-10: 50 mg via ORAL
  Filled 2024-08-08: qty 2

## 2024-08-08 MED ORDER — CLONIDINE HCL 0.1 MG PO TABS
0.1000 mg | ORAL_TABLET | Freq: Two times a day (BID) | ORAL | Status: DC
  Administered 2024-08-08 – 2024-08-13 (×10): 0.1 mg via ORAL
  Filled 2024-08-08 (×10): qty 1

## 2024-08-08 MED ORDER — HALOPERIDOL 5 MG PO TABS
5.0000 mg | ORAL_TABLET | Freq: Three times a day (TID) | ORAL | Status: DC | PRN
  Administered 2024-08-10: 5 mg via ORAL
  Filled 2024-08-08: qty 1

## 2024-08-08 MED ORDER — HALOPERIDOL LACTATE 5 MG/ML IJ SOLN: 10.0000 mg | Freq: Three times a day (TID) | INTRAMUSCULAR | Status: DC | PRN

## 2024-08-08 MED ORDER — LORAZEPAM 2 MG/ML IJ SOLN: 2.0000 mg | Freq: Three times a day (TID) | INTRAMUSCULAR | Status: DC | PRN

## 2024-08-08 MED ORDER — DEXMETHYLPHENIDATE HCL ER 5 MG PO CP24
25.0000 mg | ORAL_CAPSULE | Freq: Every day | ORAL | Status: DC
  Administered 2024-08-09 – 2024-08-13 (×5): 25 mg via ORAL
  Filled 2024-08-08 (×5): qty 5

## 2024-08-08 MED ORDER — DIPHENHYDRAMINE HCL 50 MG/ML IJ SOLN: 50.0000 mg | Freq: Three times a day (TID) | INTRAMUSCULAR | Status: DC | PRN

## 2024-08-08 MED ORDER — ALUM & MAG HYDROXIDE-SIMETH 200-200-20 MG/5ML PO SUSP: 30.0000 mL | ORAL | Status: DC | PRN

## 2024-08-08 MED ORDER — ACETAMINOPHEN 325 MG PO TABS: 650.0000 mg | ORAL_TABLET | Freq: Four times a day (QID) | ORAL | Status: DC | PRN

## 2024-08-08 NOTE — Discharge Instructions (Addendum)
 Transfer to [facility]

## 2024-08-08 NOTE — ED Provider Notes (Signed)
 FBC/OBS ASAP Discharge Summary  Date and Time: 08/08/2024 10:50 AM  Name: Randy Lane  MRN:  [id number]   Discharge Diagnoses:  Final diagnoses:  Severe episode of recurrent major depressive disorder, without psychotic features [facility])  Suicidal ideation   Chart reviewed with attending psychiatrist, Dr Kandi Hahn.   Stay Summary:  Per triage, Randy Lane is a 19 year old male presenting to [facility] escorted by [employer]. PT states he recently experienced a break up and it endorsed suicidal thoughts and the works of a possible plan to end his life. Pt states he had two recent attempts of ending his life by trying to drown himself and stand infront of a railroad track within the past week. Pt states he also cut his arm a week ago. Pt has thought of 4 suicide plans in the past few years. Pt states he currently does not have a plan to end his life, but has severe suicidal thoughts. PT states he has Autism, MDD and GAD. He is currently taking Prozac  and Sertraline as prescribed. Pt denies seeing a therapist at this time. Pt also denies substance use, Hi, NSSIB and AVH at this time.   Pt stated he came in for evaluation due to a break up. States he and his partner of 3 months broke up 2-3 weeks ago. States breakup was not mutual and was initiated by partner. Pt states he has experienced significant distress following the breakup. He reports feeling suicidal since the breakup occurred. He described multiple recents suicidal plans. He reported that one week ago (last Saturday), he attempted to drown himself in a bathtub, stated earlier this week he walked to the train tracks with the intention of jumping in front of a train but somebody talked me out of it. He stated that his call to police on 08/07/2024 was because I really wanted them to shoot me. He currently resides in [facility] group home and has been there a few months; stated this is going alright. He denied homicidal ideation, intent or plan.  Denied AVH. Patient presented with symptoms consistent with depressive episode, including persistent low mood, anhedonia, fatigue, impaired concentration, and significant feelings of hopelessness. The patient endorsed suicidal ideation and voiced different plans to end his life. He asked is there a shot that I can take that will kill me? Inpatient hospitalization was recommended. Home medications were continued. He denies suicidal ideation on today's assessment, however, remains quite depressed. He has been accepted to [facility] to Dr Marolyn Rosser and may transfer over today. Pt made aware of admission and agrees to voluntary admission.   Total Time spent with patient: 10 mins  Past Psychiatric History: Autism, ADHD, Depression, Anxiety Hospitalizations: [facility] (Apr, 2025), [facility] (March, 2023, Nov, 2022 Past Medical History: Asthma, Food allergy , Scoliosis, Urticaria    Family History: Maternal cousin: ADD   Social History: High school graduate. Single, lives in group home [facility] [address]) for the past few months. 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 Denies substance use.  Tobacco Cessation:  N/A, patient does not currently use tobacco products  Current Medications:  Current Facility-Administered Medications  Medication Dose Route Frequency Provider Last Rate Last Admin   acetaminophen  (TYLENOL ) tablet 650 mg  650 mg Oral Q6H PRN Angella Montas E, NP       alum & mag hydroxide-simeth (MAALOX/MYLANTA) 200-200-20 MG/5ML suspension 30 mL  30 mL Oral Q4H PRN Lieutenant Abarca E, NP       cloNIDine  (  CATAPRES ) tablet 0.1 mg  0.1 mg Oral BID Jimesha Rising E, NP   0.1 mg at 08/08/24 0945   dexmethylphenidate  (FOCALIN  XR) 24 hr capsule 25 mg  25 mg Oral Daily Seymore Brodowski E, NP   25 mg at 08/08/24 9060   haloperidol  (HALDOL ) tablet 5 mg  5 mg Oral TID PRN Kemisha Bonnette E, NP       And   diphenhydrAMINE  (BENADRYL ) capsule 50 mg  50 mg Oral TID PRN Tashira Torre E, NP        haloperidol  lactate (HALDOL ) injection 5 mg  5 mg Intramuscular TID PRN Maud Rubendall E, NP       And   diphenhydrAMINE  (BENADRYL ) injection 50 mg  50 mg Intramuscular TID PRN Gaynelle Pastrana E, NP       And   LORazepam  (ATIVAN ) injection 2 mg  2 mg Intramuscular TID PRN Granite Godman E, NP       haloperidol  lactate (HALDOL ) injection 10 mg  10 mg Intramuscular TID PRN Shailene Demonbreun E, NP       And   diphenhydrAMINE  (BENADRYL ) injection 50 mg  50 mg Intramuscular TID PRN Alamin Mccuiston E, NP       And   LORazepam  (ATIVAN ) injection 2 mg  2 mg Intramuscular TID PRN Adaira Centola E, NP       fluvoxaMINE  (LUVOX ) tablet 150 mg  150 mg Oral BID Amador Braddy E, NP   150 mg at 08/08/24 0940   magnesium  hydroxide (MILK OF MAGNESIA) suspension 30 mL  30 mL Oral Daily PRN Kiowa Peifer E, NP       Current Outpatient Medications  Medication Sig Dispense Refill   albuterol  (VENTOLIN  HFA) 108 (90 Base) MCG/ACT inhaler Inhale 2 puffs into the lungs every 4 (four) hours as needed for wheezing or shortness of breath. 18 each 1   Azelastine -Fluticasone  137-50 MCG/ACT SUSP Place into the nose.     budesonide -formoterol  (SYMBICORT ) 160-4.5 MCG/ACT inhaler Inhale 2 puffs into the lungs 2 (two) times daily. 1 each 12   cloNIDine  (CATAPRES ) 0.1 MG tablet Take 0.1 mg by mouth 2 (two) times daily.     EPINEPHrine  0.3 mg/0.3 mL IJ SOAJ injection Inject 0.3 mg into the muscle as needed for anaphylaxis. 2 each 2   fluvoxaMINE  (LUVOX ) 50 MG tablet Take 3 tablets (150 mg total) by mouth 2 (two) times daily. 180 tablet 0   levocetirizine (XYZAL ) 5 MG tablet TAKE ONE TABLET BY MOUTH EVERY EVENING 30 tablet 5   loratadine  (CLARITIN ) 10 MG tablet Take 1 tablet (10 mg total) by mouth every evening. 15 tablet 0    PTA Medications:  Facility Ordered Medications  Medication   acetaminophen  (TYLENOL ) tablet 650 mg   alum & mag hydroxide-simeth (MAALOX/MYLANTA) 200-200-20 MG/5ML suspension 30 mL   magnesium  hydroxide (MILK OF  MAGNESIA) suspension 30 mL   haloperidol  (HALDOL ) tablet 5 mg   And   diphenhydrAMINE  (BENADRYL ) capsule 50 mg   haloperidol  lactate (HALDOL ) injection 5 mg   And   diphenhydrAMINE  (BENADRYL ) injection 50 mg   And   LORazepam  (ATIVAN ) injection 2 mg   haloperidol  lactate (HALDOL ) injection 10 mg   And   diphenhydrAMINE  (BENADRYL ) injection 50 mg   And   LORazepam  (ATIVAN ) injection 2 mg   cloNIDine  (CATAPRES ) tablet 0.1 mg   dexmethylphenidate  (FOCALIN  XR) 24 hr capsule 25 mg   fluvoxaMINE  (LUVOX ) tablet 150 mg   PTA Medications  Medication Sig   fluvoxaMINE  (LUVOX )  50 MG tablet Take 3 tablets (150 mg total) by mouth 2 (two) times daily.   loratadine  (CLARITIN ) 10 MG tablet Take 1 tablet (10 mg total) by mouth every evening.   budesonide -formoterol  (SYMBICORT ) 160-4.5 MCG/ACT inhaler Inhale 2 puffs into the lungs 2 (two) times daily.   cloNIDine  (CATAPRES ) 0.1 MG tablet Take 0.1 mg by mouth 2 (two) times daily.   levocetirizine (XYZAL ) 5 MG tablet TAKE ONE TABLET BY MOUTH EVERY EVENING   Azelastine -Fluticasone  137-50 MCG/ACT SUSP Place into the nose.   albuterol  (VENTOLIN  HFA) 108 (90 Base) MCG/ACT inhaler Inhale 2 puffs into the lungs every 4 (four) hours as needed for wheezing or shortness of breath.   EPINEPHrine  0.3 mg/0.3 mL IJ SOAJ injection Inject 0.3 mg into the muscle as needed for anaphylaxis.       11/12/2023    9:20 AM 11/10/2023    3:30 PM  Depression screen PHQ 2/9  Decreased Interest 0 0  Down, Depressed, Hopeless 0 0  PHQ - 2 Score 0 0    Flowsheet Row ED from 08/07/2024 in [facility] Most recent reading at 08/07/2024  8:20 PM ED from 11/08/2023 in [facility] Most recent reading at 11/08/2023 12:22 PM ED from 11/08/2023 in [facility] Most recent reading at 11/08/2023  2:29 AM  C-SSRS RISK CATEGORY Moderate Risk Low Risk Low Risk    Musculoskeletal  Strength & Muscle Tone:  within normal limits Gait & Station: normal Patient leans: N/A  Psychiatric Specialty Exam  Presentation  General Appearance:  Fairly Groomed; Appropriate for Environment  Eye Contact: Poor  Speech: Clear and Coherent; Slow  Speech Volume: Decreased  Handedness: Right   Mood and Affect  Mood: Hopeless; Depressed  Affect: Depressed; Congruent   Thought Process  Thought Processes: Goal Directed  Descriptions of Associations:Intact  Orientation:Full (Time, Place and Person)  Thought Content:Logical  Diagnosis of Schizophrenia or Schizoaffective disorder in past: No    Hallucinations:Hallucinations: None  Ideas of Reference:None  Suicidal Thoughts:Suicidal Thoughts: Yes, Active SI Active Intent and/or Plan: With Plan  Homicidal Thoughts:Homicidal Thoughts: No   Sensorium  Memory: Immediate Good; Recent Good  Judgment: Poor  Insight: Poor   Executive Functions  Concentration: Fair  Attention Span: Fair  Recall: Good  Fund of Knowledge: Good  Language: Good   Psychomotor Activity  Psychomotor Activity: Psychomotor Activity: Decreased   Assets  Assets: Communication Skills; Desire for Improvement; Housing; Vocational/Educational   Sleep  Sleep: Sleep: Fair  Estimated Sleeping Duration (Last 24 Hours): 10.25-11.50 hours  Nutritional Assessment (For OBS and FBC admissions only) Has the patient had a weight loss or gain of 10 pounds or more in the last 3 months?: No Has the patient had a decrease in food intake/or appetite?: No Does the patient have dental problems?: No Does the patient have eating habits or behaviors that may be indicators of an eating disorder including binging or inducing vomiting?: No Has the patient recently lost weight without trying?: 0 Has the patient been eating poorly because of a decreased appetite?: 0 Malnutrition Screening Tool Score: 0    Physical Exam  Physical Exam Vitals and nursing  note reviewed.  HENT:     Head: Normocephalic.     Mouth/Throat:     Mouth: Mucous membranes are moist.  Cardiovascular:     Rate and Rhythm: Normal rate.  Pulmonary:     Effort: Pulmonary effort is normal.  Skin:    General:  Skin is warm and dry.  Neurological:     Mental Status: He is alert and oriented to person, place, and time.  Psychiatric:     Comments: See HPI    Review of Systems  Constitutional:  Negative for chills and fever.  HENT:  Negative for congestion and sore throat.   Respiratory:  Negative for cough and shortness of breath.   Cardiovascular:  Negative for chest pain and palpitations.  Psychiatric/Behavioral:  Positive for depression. Negative for substance abuse and suicidal ideas.    Blood pressure 136/84, pulse 85, temperature 98.6 F (37 C), temperature source Oral, resp. rate 18, SpO2 99%. There is no height or weight on file to calculate BMI.  Demographic Factors:  Adolescent or young adult  Disposition: Transfer to [facility] Rm [id number]  Sherrell Culver, PMHNP-BC, FNP-BC  08/08/2024, 11:08 AM

## 2024-08-08 NOTE — Group Note (Signed)
 Date:  08/08/2024 Time:  3:58 PM  Group Topic/Focus:  Wellness Toolbox:   The focus of this group is to discuss various aspects of wellness, balancing those aspects and exploring ways to increase the ability to experience wellness.  Patients will create a wellness toolbox for use upon discharge.    Participation Level:  Active  Participation Quality:  Appropriate and Attentive  Affect:  Appropriate  Cognitive:  Alert and Appropriate  Insight: Appropriate and Good  Engagement in Group:  Engaged  Modes of Intervention:  Discussion and Education   Randy Lane 08/08/2024, 3:58 PM

## 2024-08-08 NOTE — Group Note (Signed)
 Date:  08/08/2024 Time:  6:48 PM  Group Topic/Focus:  Wellness Toolbox:   The focus of this group is to discuss various aspects of wellness, balancing those aspects and exploring ways to increase the ability to experience wellness.  Patients will create a wellness toolbox for use upon discharge.    Participation Level:  Active  Participation Quality:  Attentive  Affect:  Appropriate  Cognitive:  Appropriate  Insight: Appropriate  Engagement in Group:  Engaged  Modes of Intervention:  Discussion and Education  Additional Comments:    [name] Huddle 08/08/2024, 6:48 PM

## 2024-08-08 NOTE — ED Notes (Signed)
 Patient is calm and pleasant without issue or complaint.  He is social with peer and took meds without complaint.  He is now watching TV.  Patient denies avh shi or plan..Will monitor.

## 2024-08-08 NOTE — Tx Team (Signed)
 Initial Treatment Plan 08/08/2024 1:26 PM Randy Lane FMW:[id number]    PATIENT STRESSORS: Health problems   Marital or family conflict   Other: Broke up with Girlfriend     PATIENT STRENGTHS: Motivation for treatment/growth  Supportive family/friends    PATIENT IDENTIFIED PROBLEMS: To go home  Learn how to work on trust issues and myself  Suicidal ideation  Depression  Anxiety  Ineffective coping skills           DISCHARGE CRITERIA:  Ability to meet basic life and health needs Adequate post-discharge living arrangements Motivation to continue treatment in a less acute level of care  PRELIMINARY DISCHARGE PLAN: Attend aftercare/continuing care group Outpatient therapy Return to previous living arrangement  PATIENT/FAMILY INVOLVEMENT: This treatment plan has been presented to and reviewed with the patient, Randy Lane, and/or family member.  The patient and family have been given the opportunity to ask questions and make suggestions.  [name], RN 08/08/2024, 1:26 PM

## 2024-08-08 NOTE — Plan of Care (Signed)
 " Problem: Education: Goal: Knowledge of [facility] Education information/materials will improve Outcome: Progressing Goal: Emotional status will improve Outcome: Progressing Goal: Mental status will improve Outcome: Progressing Goal: Verbalization of understanding the information provided will improve Outcome: Progressing   Problem: Activity: Goal: Interest or engagement in activities will improve Outcome: Progressing Goal: Sleeping patterns will improve Outcome: Progressing   Problem: Coping: Goal: Ability to verbalize frustrations and anger appropriately will improve Outcome: Progressing Goal: Ability to demonstrate self-control will improve Outcome: Progressing   Problem: Health Behavior/Discharge Planning: Goal: Identification of resources available to assist in meeting health care needs will improve Outcome: Progressing Goal: Compliance with treatment plan for underlying cause of condition will improve Outcome: Progressing   Problem: Physical Regulation: Goal: Ability to maintain clinical measurements within normal limits will improve Outcome: Progressing   Problem: Safety: Goal: Periods of time without injury will increase Outcome: Progressing   Problem: Education: Goal: Knowledge of [facility] Education information/materials will improve Outcome: Progressing Goal: Emotional status will improve Outcome: Progressing Goal: Mental status will improve Outcome: Progressing Goal: Verbalization of understanding the information provided will improve Outcome: Progressing   Problem: Activity: Goal: Interest or engagement in activities will improve Outcome: Progressing Goal: Sleeping patterns will improve Outcome: Progressing   Problem: Coping: Goal: Ability to verbalize frustrations and anger appropriately will improve Outcome: Progressing Goal: Ability to demonstrate self-control will improve Outcome: Progressing   Problem: Health  Behavior/Discharge Planning: Goal: Identification of resources available to assist in meeting health care needs will improve Outcome: Progressing Goal: Compliance with treatment plan for underlying cause of condition will improve Outcome: Progressing   Problem: Physical Regulation: Goal: Ability to maintain clinical measurements within normal limits will improve Outcome: Progressing   Problem: Safety: Goal: Periods of time without injury will increase Outcome: Progressing   Problem: Education: Goal: Knowledge of [facility] Education information/materials will improve Outcome: Progressing Goal: Emotional status will improve Outcome: Progressing Goal: Mental status will improve Outcome: Progressing Goal: Verbalization of understanding the information provided will improve Outcome: Progressing   Problem: Activity: Goal: Interest or engagement in activities will improve Outcome: Progressing Goal: Sleeping patterns will improve Outcome: Progressing   Problem: Coping: Goal: Ability to verbalize frustrations and anger appropriately will improve Outcome: Progressing Goal: Ability to demonstrate self-control will improve Outcome: Progressing   Problem: Health Behavior/Discharge Planning: Goal: Identification of resources available to assist in meeting health care needs will improve Outcome: Progressing Goal: Compliance with treatment plan for underlying cause of condition will improve Outcome: Progressing   Problem: Physical Regulation: Goal: Ability to maintain clinical measurements within normal limits will improve Outcome: Progressing   Problem: Safety: Goal: Periods of time without injury will increase Outcome: Progressing   Problem: Activity: Goal: Interest or engagement in leisure activities will improve Outcome: Progressing Goal: Imbalance in normal sleep/wake cycle will improve Outcome: Progressing   Problem: Coping: Goal: Coping ability will  improve Outcome: Progressing Goal: Will verbalize feelings Outcome: Progressing   Problem: Health Behavior/Discharge Planning: Goal: Identification of resources available to assist in meeting health care needs will improve Outcome: Progressing   Problem: Medication: Goal: Compliance with prescribed medication regimen will improve Outcome: Progressing   Problem: Self-Concept: Goal: Ability to disclose and discuss suicidal ideas will improve Outcome: Progressing Goal: Will verbalize positive feelings about self Outcome: Progressing   Problem: Coping: Goal: Ability to identify and develop effective coping behavior will improve Outcome: Progressing   Problem: Coping: Goal: Ability to identify and develop effective coping behavior will  improve Outcome: Progressing Goal: Ability to interact with others will improve Outcome: Progressing Goal: Demonstration of participation in decision-making regarding own care will improve Outcome: Progressing Goal: Ability to use eye contact when communicating with others will improve Outcome: Progressing   Problem: Health Behavior/Discharge Planning: Goal: Identification of resources available to assist in meeting health care needs will improve Outcome: Progressing   "

## 2024-08-08 NOTE — ED Notes (Signed)
 Pt sleeping in bed, no acute distress noted. Respirations even and unlabored. Continue to monitor for safety.

## 2024-08-08 NOTE — Group Note (Signed)
 Date:  08/08/2024 Time:  10:14 PM  Group Topic/Focus:  Goals Group:   The focus of this group is to help patients establish daily goals to achieve during treatment and discuss how the patient can incorporate goal setting into their daily lives to aide in recovery. Wrap-Up Group:   The focus of this group is to help patients review their daily goal of treatment and discuss progress on daily workbooks.    Participation Level:  Active  Participation Quality:  Sharing  Affect:  Appropriate  Cognitive:  Appropriate  Insight: Good  Engagement in Group:  Engaged  Modes of Intervention:  Discussion  Additional Comments:  Patient is looking forward to the counseling resources that will be provided to him, he also expecting discharge this week.    Kristen H Shaquella Stamant 08/08/2024, 10:14 PM

## 2024-08-08 NOTE — Progress Notes (Signed)
 Mercie VEAR Banana, RN, 08/08/24, Time of arrival: 12:30 PM  Patient is a new admit to the adult unit from [facility] via [employer]. Patient is voluntary. Patient reports being on the adolescent unit in the past. Patient has a history of anxiety disorder. Patient stated reason for admission/reason for being here is due to having a recent relationship break up. Pt reports, It all happened so quickly. Patient reports this being their recent stressor. No reported or observed physiological concerns or abnormalities. Patient denies any history of physical sexual, emotional, or financial abuse, alcohol/substance abuse, and denies any current SI, HI, AVH. Patient's says they're a good worker and a good reader. Patient's goal during this admission is to Learn how to work on trust issues and myself.  Patient belongings addressed and stored. Skin check performed with two staff, results unremarkable. Vital signs unremarkable. Patient engaged with assessment with encouragement. Patient orientated to facility, unit and room. All questions and concerns addressed at this time.

## 2024-08-09 ENCOUNTER — Encounter (HOSPITAL_COMMUNITY): Payer: Self-pay

## 2024-08-09 MED ORDER — HYDROXYZINE HCL 25 MG PO TABS
25.0000 mg | ORAL_TABLET | Freq: Once | ORAL | Status: AC
  Administered 2024-08-09: 25 mg via ORAL
  Filled 2024-08-09: qty 1

## 2024-08-09 MED ORDER — LORATADINE 10 MG PO TABS
10.0000 mg | ORAL_TABLET | Freq: Every evening | ORAL | Status: DC
  Administered 2024-08-09 – 2024-08-12 (×4): 10 mg via ORAL
  Filled 2024-08-09 (×4): qty 1

## 2024-08-09 MED ORDER — ALBUTEROL SULFATE HFA 108 (90 BASE) MCG/ACT IN AERS: 2.0000 | INHALATION_SPRAY | RESPIRATORY_TRACT | Status: DC | PRN

## 2024-08-09 MED ORDER — FLUTICASONE PROPIONATE HFA 110 MCG/ACT IN AERO
2.0000 | INHALATION_SPRAY | Freq: Two times a day (BID) | RESPIRATORY_TRACT | Status: DC
  Administered 2024-08-09 – 2024-08-13 (×8): 2 via RESPIRATORY_TRACT
  Filled 2024-08-09: qty 12

## 2024-08-09 MED ORDER — VITAMIN D (ERGOCALCIFEROL) 1.25 MG (50000 UNIT) PO CAPS
50000.0000 [IU] | ORAL_CAPSULE | ORAL | Status: DC
  Administered 2024-08-09: 50000 [IU] via ORAL
  Filled 2024-08-09: qty 1

## 2024-08-09 MED ORDER — FLUOXETINE HCL 10 MG PO CAPS
10.0000 mg | ORAL_CAPSULE | Freq: Every day | ORAL | Status: DC
  Administered 2024-08-09 – 2024-08-13 (×5): 10 mg via ORAL
  Filled 2024-08-09 (×6): qty 1

## 2024-08-09 NOTE — Plan of Care (Signed)
   Problem: Education: Goal: Knowledge of [facility] Education information/materials will improve Outcome: Progressing   Problem: Activity: Goal: Interest or engagement in activities will improve Outcome: Progressing   Problem: Coping: Goal: Ability to verbalize frustrations and anger appropriately will improve Outcome: Progressing   Problem: Safety: Goal: Periods of time without injury will increase Outcome: Progressing

## 2024-08-09 NOTE — BH IP Treatment Plan (Signed)
 Interdisciplinary Treatment and Diagnostic Plan Update  08/09/2024 Time of Session: 10:05 am Randy Lane MRN: [id number]  Principal Diagnosis: MDD (major depressive disorder), recurrent severe, without psychosis (HCC)  Secondary Diagnoses: Principal Problem:   MDD (major depressive disorder), recurrent severe, without psychosis (HCC)   Current Medications:  Current Facility-Administered Medications  Medication Dose Route Frequency Provider Last Rate Last Admin   acetaminophen  (TYLENOL ) tablet 650 mg  650 mg Oral Q6H PRN Hobson, Fran E, NP       albuterol  (VENTOLIN  HFA) 108 (90 Base) MCG/ACT inhaler 2 puff  2 puff Inhalation Q4H PRN Ntuen, Tina C, FNP       alum & mag hydroxide-simeth (MAALOX/MYLANTA) 200-200-20 MG/5ML suspension 30 mL  30 mL Oral Q4H PRN Hobson, Fran E, NP       cloNIDine  (CATAPRES ) tablet 0.1 mg  0.1 mg Oral BID Hobson, Fran E, NP   0.1 mg at 08/09/24 9070   dexmethylphenidate  (FOCALIN  XR) 24 hr capsule 25 mg  25 mg Oral Daily Hobson, Fran E, NP   25 mg at 08/09/24 9070   haloperidol  (HALDOL ) tablet 5 mg  5 mg Oral TID PRN Hobson, Fran E, NP       And   diphenhydrAMINE  (BENADRYL ) capsule 50 mg  50 mg Oral TID PRN Hobson, Fran E, NP       haloperidol  lactate (HALDOL ) injection 5 mg  5 mg Intramuscular TID PRN Hobson, Fran E, NP       And   diphenhydrAMINE  (BENADRYL ) injection 50 mg  50 mg Intramuscular TID PRN Hobson, Fran E, NP       And   LORazepam  (ATIVAN ) injection 2 mg  2 mg Intramuscular TID PRN Hobson, Fran E, NP       haloperidol  lactate (HALDOL ) injection 10 mg  10 mg Intramuscular TID PRN Hobson, Fran E, NP       And   diphenhydrAMINE  (BENADRYL ) injection 50 mg  50 mg Intramuscular TID PRN Hobson, Fran E, NP       And   LORazepam  (ATIVAN ) injection 2 mg  2 mg Intramuscular TID PRN Hobson, Fran E, NP       FLUoxetine  (PROZAC ) capsule 10 mg  10 mg Oral Daily Ntuen, Tina C, FNP       fluticasone  (FLOVENT  HFA) 110 MCG/ACT inhaler 2 puff  2 puff Inhalation  BID Ntuen, Tina C, FNP       fluvoxaMINE  (LUVOX ) tablet 150 mg  150 mg Oral BID Hobson, Fran E, NP   150 mg at 08/09/24 9070   loratadine  (CLARITIN ) tablet 10 mg  10 mg Oral QPM Ntuen, Tina C, FNP       magnesium  hydroxide (MILK OF MAGNESIA) suspension 30 mL  30 mL Oral Daily PRN Hobson, Fran E, NP       PTA Medications: Medications Prior to Admission  Medication Sig Dispense Refill Last Dose/Taking   acetaminophen  (TYLENOL ) 325 MG tablet Take 650 mg by mouth every 6 (six) hours as needed for headache.   Taking As Needed   Azelastine -Fluticasone  137-50 MCG/ACT SUSP Place 1 spray into the nose daily.   Past Week   budesonide -formoterol  (SYMBICORT ) 160-4.5 MCG/ACT inhaler Inhale 2 puffs into the lungs 2 (two) times daily. 1 each 12 08/07/2024 Evening   cloNIDine  (CATAPRES ) 0.1 MG tablet Take 0.1 mg by mouth 2 (two) times daily.   08/07/2024 Evening   Dexmethylphenidate  HCl 25 MG CP24 Take 1 capsule by mouth every morning.   08/07/2024 Morning  fluticasone  (FLOVENT  HFA) 110 MCG/ACT inhaler Inhale 2 puffs into the lungs 2 (two) times daily.   Past Week   fluvoxaMINE  (LUVOX ) 100 MG tablet Take 150 mg by mouth 2 (two) times daily.   08/07/2024 Evening   levocetirizine (XYZAL ) 5 MG tablet TAKE ONE TABLET BY MOUTH EVERY EVENING 30 tablet 5 08/07/2024 Evening   albuterol  (VENTOLIN  HFA) 108 (90 Base) MCG/ACT inhaler Inhale 2 puffs into the lungs every 4 (four) hours as needed for wheezing or shortness of breath. 18 each 1    EPINEPHrine  0.3 mg/0.3 mL IJ SOAJ injection Inject 0.3 mg into the muscle as needed for anaphylaxis. 2 each 2     Patient Stressors: Health problems   Marital or family conflict   Other: Broke up with Girlfriend    Patient Strengths: Motivation for treatment/growth  Supportive family/friends   Treatment Modalities: Medication Management, Group therapy, Case management,  1 to 1 session with clinician, Psychoeducation, Recreational therapy.   Physician Treatment Plan for Primary  Diagnosis: MDD (major depressive disorder), recurrent severe, without psychosis (HCC) Long Term Goal(s): Improvement in symptoms so as ready for discharge   Short Term Goals: Ability to identify changes in lifestyle to reduce recurrence of condition will improve Ability to verbalize feelings will improve Ability to disclose and discuss suicidal ideas Ability to demonstrate self-control will improve Ability to identify and develop effective coping behaviors will improve Ability to maintain clinical measurements within normal limits will improve Compliance with prescribed medications will improve Ability to identify triggers associated with substance abuse/mental health issues will improve  Medication Management: Evaluate patient's response, side effects, and tolerance of medication regimen.  Therapeutic Interventions: 1 to 1 sessions, Unit Group sessions and Medication administration.  Evaluation of Outcomes: Not Progressing  Physician Treatment Plan for Secondary Diagnosis: Principal Problem:   MDD (major depressive disorder), recurrent severe, without psychosis (HCC)  Long Term Goal(s): Improvement in symptoms so as ready for discharge   Short Term Goals: Ability to identify changes in lifestyle to reduce recurrence of condition will improve Ability to verbalize feelings will improve Ability to disclose and discuss suicidal ideas Ability to demonstrate self-control will improve Ability to identify and develop effective coping behaviors will improve Ability to maintain clinical measurements within normal limits will improve Compliance with prescribed medications will improve Ability to identify triggers associated with substance abuse/mental health issues will improve     Medication Management: Evaluate patient's response, side effects, and tolerance of medication regimen.  Therapeutic Interventions: 1 to 1 sessions, Unit Group sessions and Medication administration.  Evaluation  of Outcomes: Not Progressing   RN Treatment Plan for Primary Diagnosis: MDD (major depressive disorder), recurrent severe, without psychosis (HCC) Long Term Goal(s): Knowledge of disease and therapeutic regimen to maintain health will improve  Short Term Goals: Ability to remain free from injury will improve, Ability to verbalize frustration and anger appropriately will improve, Ability to demonstrate self-control, Ability to participate in decision making will improve, Ability to verbalize feelings will improve, Ability to disclose and discuss suicidal ideas, Ability to identify and develop effective coping behaviors will improve, and Compliance with prescribed medications will improve  Medication Management: RN will administer medications as ordered by provider, will assess and evaluate patient's response and provide education to patient for prescribed medication. RN will report any adverse and/or side effects to prescribing provider.  Therapeutic Interventions: 1 on 1 counseling sessions, Psychoeducation, Medication administration, Evaluate responses to treatment, Monitor vital signs and CBGs as ordered, Perform/monitor CIWA, COWS, AIMS  and Fall Risk screenings as ordered, Perform wound care treatments as ordered.  Evaluation of Outcomes: Not Progressing   LCSW Treatment Plan for Primary Diagnosis: MDD (major depressive disorder), recurrent severe, without psychosis (HCC) Long Term Goal(s): Safe transition to appropriate next level of care at discharge, Engage patient in therapeutic group addressing interpersonal concerns.  Short Term Goals: Engage patient in aftercare planning with referrals and resources, Increase social support, Increase ability to appropriately verbalize feelings, Increase emotional regulation, Facilitate acceptance of mental health diagnosis and concerns, Facilitate patient progression through stages of change regarding substance use diagnoses and concerns, Identify  triggers associated with mental health/substance abuse issues, and Increase skills for wellness and recovery  Therapeutic Interventions: Assess for all discharge needs, 1 to 1 time with Social worker, Explore available resources and support systems, Assess for adequacy in community support network, Educate family and significant other(s) on suicide prevention, Complete Psychosocial Assessment, Interpersonal group therapy.  Evaluation of Outcomes: Not Progressing   Progress in Treatment: Attending groups: Yes. Participating in groups: Yes. Taking medication as prescribed: Yes. Toleration medication: Yes. Family/Significant other contact made: No, will contact:  Garion Wempe (mother) [phone]. Patient understands diagnosis: Yes. Discussing patient identified problems/goals with staff: Yes. Medical problems stabilized or resolved: Yes. Denies suicidal/homicidal ideation: Yes. Issues/concerns per patient self-inventory: No. None reported.  New problem(s) identified: No, Describe:  None identified.  New Short Term/Long Term Goal(s): medication stabilization, elimination of SI thoughts, development of comprehensive mental wellness plan.    Patient Goals: Finding counseling. I want to work on listening, building trust, and learning ways to cope with mistrust or fear.  Discharge Plan or Barriers: Patient recently admitted. CSW will continue to follow and assess for appropriate referrals and possible discharge planning.    Reason for Continuation of Hospitalization: Anxiety Depression Medication stabilization  Estimated Length of Stay: 3-5 days.  Last 3 [place] Suicide Severity Risk Score: Flowsheet Row Ad[facility] (Current[facility] from 08/07/2024 in [facility] ED from 11/08/2023 in [facility]  C-SSRS RISK CATEGORY Moderate Risk Moderate Risk Low Risk    Last PHQ 2/9  Scores:    11/12/2023    9:20 AM 11/10/2023    3:30 PM  Depression screen PHQ 2/9  Decreased Interest 0 0  Down, Depressed, Hopeless 0 0  PHQ - 2 Score 0 0    Scribe for Treatment Team: Randy Lane 08/09/2024 1:11 PM

## 2024-08-09 NOTE — BHH Counselor (Signed)
 Adult Comprehensive Assessment  Patient ID: Randy Lane, male   DOB: [birth year], 19 y.o.   MRN: [id number]  Information Source: Information source: Patient  Current Stressors:  Patient states their primary concerns and needs for treatment are:: I'm sad.  Had a breakup two weeks ago. Patient states their goals for this hospitilization and ongoing recovery are:: Get more support, outside treatment, therapy, group therapy and cool off better. Educational / Learning stressors: None reported. Employment / Job issues: Employed Family Relationships: Okay. Financial / Lack of resources (include bankruptcy): None reported. Housing / Lack of housing: None reported; lives in a group home at present. Physical health (include injuries & life threatening diseases): Pretty good. Social relationships: Okay. Substance abuse: Denies use of any and all substances. Bereavement / Loss: Denies  Living/Environment/Situation:  Living Arrangements: Group Home Living conditions (as described by patient or guardian): I live at [facility]. Who else lives in the home?: I have two other roomates. How long has patient lived in current situation?: 3 months (patient was nodding his head as he was very drowsy) What is atmosphere in current home: Comfortable  Family History:  Marital status: Single Are you sexually active?: No What is your sexual orientation?: Heterosexual Has your sexual activity been affected by drugs, alcohol, medication, or emotional stress?: No Does patient have children?: No  Childhood History:  By whom was/is the patient raised?: Mother Additional childhood history information: father and mother  seperated before he was born Description of patient's relationship with caregiver when they were a child: Good Patient's description of current relationship with people who raised him/her: Good How were you disciplined when you got in trouble as a child/adolescent?:  Verbally and sometimes spankings. Does patient have siblings?: No Did patient suffer any verbal/emotional/physical/sexual abuse as a child?: No Did patient suffer from severe childhood neglect?: No Has patient ever been sexually abused/assaulted/raped as an adolescent or adult?: No Was the patient ever a victim of a crime or a disaster?: No Witnessed domestic violence?: No Has patient been affected by domestic violence as an adult?: No  Education:  Highest grade of school patient has completed: 12 Currently a student?: No Learning disability?: No  Employment/Work Situation:   Work Stressors: None reported. 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 Has Patient ever Been in the [employer]?: No  Financial Resources:   Financial resources: Income from employment Does patient have a representative payee or guardian?: No  Alcohol/Substance Abuse:   What has been your use of drugs/alcohol within the last 12 months?: Denies use of any and all substances. If attempted suicide, did drugs/alcohol play a role in this?: No Alcohol/Substance Abuse Treatment Hx: Denies past history If yes, describe treatment and response: N/A Is patient motivated for change?: Yes Does patient live in an environment that promotes recovery or serves as an obstacle to recovery?: Yes - promotes recovery Describe how the environment promotes recovery or serves as an obstacle to recovery: Pt stays in a group home that supports his overall mental health. Are others in the home using alcohol or other substances?: No Are significant others in the home willing to participate in the patient's care?: No Has alcohol/substance abuse ever caused legal problems?: No  Social Support System:   Patient's Community Support System: Good Describe Community Support System: My mom, aunt, my  roomates. Type  of faith/religion: Denies being religiously affiliated. How does patient's faith help to cope with current illness?: N/A  Leisure/Recreation:   Do You Have Hobbies?: Yes Leisure and Hobbies: I work.  Strengths/Needs:   What is the patient's perception of their strengths?: I read, literature, good story-teller, I am a print production planner. Patient states they can use these personal strengths during their treatment to contribute to their recovery: Pt states he reads to cope with challenges in life and uses that to escape reality oftentimes. Patient states these barriers may affect/interfere with their treatment: None reported. Patient states these barriers may affect their return to the community: None reported. Other important information patient would like considered in planning for their treatment: None reported.  Discharge Plan:   Currently receiving community mental health services: No Patient states concerns and preferences for aftercare planning are: Pt would like to have medication management and therapy including individual and group. Patient states they will know when they are safe and ready for discharge when: When I smile more and talk more. Does patient have access to transportation?: No Does patient have financial barriers related to discharge medications?: No Patient description of barriers related to discharge medications: N/A Plan for no access to transportation at discharge: Pt will be transported via taxi voucher. Will patient be returning to same living situation after discharge?: Yes  Summary/Recommendations:   Summary and Recommendations (to be completed by the evaluator): Patient, Randy Lane, is a 19 year old AA teenage male who voluntarily presents to [facility] from [facility] for suicidal ideation and increased depressive symptoms following a recent break-up with his girlfriend of three months. Pt states breakup was not mutual and was initiated by his  partner and has experienced significant distress following the breakup. He reports feeling suicidal since the breakup occurred. He reports that one week ago, he attempted to drown himself in a bathtub, states earlier this week he walked to the train tracks with the intention of jumping in front of a train but somebody talked me out of it. He states today he called the police from his group home because I really wanted them to shoot me. He states he called out of work today which the first time since the break up. States he is currently residing in a [facility] group home and has been there a few months; he seems to enjoy being at the group home with his two roomates.  UDS confirms presence of no substances as endorsed during the assessment.  He prefers to have group/individual therapy if possible. While here, [place] can benefit from crisis stabilization, medication management, therapeutic milieu, and referrals for services.   Derick JONELLE Blanch, LCSW 08/09/2024

## 2024-08-09 NOTE — Group Note (Unsigned)
 Date:  08/09/2024 Time:  12:47 PM  Group Topic/Focus:  Emotional Education:   The focus of this group is to discuss what feelings/emotions are, and how they are experienced.     Participation Level:  {BHH PARTICIPATION OZCZO:77735}  Participation Quality:  {BHH PARTICIPATION QUALITY:22265}  Affect:  {BHH AFFECT:22266}  Cognitive:  {BHH COGNITIVE:22267}  Insight: {BHH Insight2:20797}  Engagement in Group:  {BHH ENGAGEMENT IN HMNLE:77731}  Modes of Intervention:  {BHH MODES OF INTERVENTION:22269}  Additional Comments:  ***  Randy Lane L Janaysha Depaulo 08/09/2024, 12:47 PM

## 2024-08-09 NOTE — Plan of Care (Signed)
   Problem: Education: Goal: Knowledge of [place] [facility] Education information/materials will improve Outcome: Progressing Goal: Emotional status will improve Outcome: Progressing Goal: Mental status will improve Outcome: Progressing Goal: Verbalization of understanding the information provided will improve Outcome: Progressing

## 2024-08-09 NOTE — Group Note (Signed)
 Recreation Therapy Group Note   Group Topic:Communication  Group Date: 08/09/2024 Start Time: 0944 End Time: 1009 Facilitators: Alese Furniss-McCall, LRT,CTRS Location: [address] Day[address]   Group Topic: Communication, Problem Solving   Goal Area(s) Addresses:  Patient will effectively listen to complete activity.  Patient will identify communication skills used to make activity successful.  Patient will identify how skills used during activity can be used to reach post d/c goals.    Behavioral Response: Engaged   Intervention: Building Surveyor Activity - Geometric pattern cards, pencils, blank paper    Activity: Geometric Drawings.  Three volunteers from the peer group will be shown an abstract picture with a particular arrangement of geometrical shapes.  Each round, one 'speaker' will describe the pattern, as accurately as possible without revealing the image to the group.  The remaining group members will listen and draw the picture to reflect how it is described to them. Patients with the role of 'listener' cannot ask clarifying questions but, may request that the speaker repeat a direction. Once the drawings are complete, the presenter will show the rest of the group the picture and compare how close each person came to drawing the picture. LRT will facilitate a post-activity discussion regarding effective communication and the importance of planning, listening, and asking for clarification in daily interactions with others.  Education: Environmental consultant, Active listening, Support systems, Discharge planning  Education Outcome: Acknowledges understanding/In group clarification offered/Needs additional education.    Affect/Mood: Appropriate   Participation Level: Engaged   Participation Quality: Independent   Behavior: Appropriate   Speech/Thought Process: Focused   Insight: Good   Judgement: Good   Modes of Intervention: Clarification   Patient Response to  Interventions:  Engaged   Education Outcome:  In group clarification offered    Clinical Observations/Individualized Feedback: Pt attended and participated in group session.    Plan: Continue to engage patient in RT group sessions 2-3x/week.   Emilio Baylock-McCall, LRT,CTRS 08/09/2024 12:45 PM

## 2024-08-09 NOTE — Progress Notes (Signed)
" °   08/09/24 0944  Psych Admission Type (Psych Patients Only)  Admission Status Voluntary  Psychosocial Assessment  Patient Complaints None  Eye Contact Fair  Facial Expression Flat  Affect Depressed  Speech Logical/coherent  Interaction Cautious  Motor Activity Slow  Appearance/Hygiene Unremarkable  Behavior Characteristics Cooperative  Mood Euthymic  Thought Process  Coherency WDL  Content WDL  Delusions None reported or observed  Perception WDL  Hallucination None reported or observed  Judgment Impaired  Confusion None  Danger to Self  Current suicidal ideation? Denies  Description of Suicide Plan No plan  Self-Injurious Behavior No self-injurious ideation or behavior indicators observed or expressed   Agreement Not to Harm Self Yes  Description of Agreement verbal  Danger to Others  Danger to Others None reported or observed    "

## 2024-08-09 NOTE — H&P (Signed)
 " Psychiatric Admission Assessment Adult  Patient Identification: Randy Lane MRN:  [id number] Date of Evaluation:  08/09/2024 Chief Complaint:  MDD (major depressive disorder), recurrent severe, without psychosis (HCC) [F33.2] Principal Diagnosis: MDD (major depressive disorder), recurrent severe, without psychosis (HCC) Diagnosis:  Principal Problem:   MDD (major depressive disorder), recurrent severe, without psychosis (HCC)  CC: I broke up with my girlfriend I am having obsessive thought of harming myself.  History of Present Illness: Randy Lane is a 19 year old African-American male with prior psychiatric diagnoses significant for major depressive disorder without psychosis, ADHD, GAD, autism spectrum disorder, DMDD, and OCD who presents voluntarily to [facility] from [facility] behavioral health urgent care for worsening depression, SI with plans to drown himself in the bathtub or lie on a railroad track to be ran over by a train in the context of break-up with Girlfriend of 3 months, a week ago.  Patient has PMHx  significant for asthma, scoliosis with surgical correction in 2022.  BAL less than 15, UDS negative for any substances.  After medical evaluation, stabilization and clearance patient was transferred to [facility] for further psychiatric evaluation and treatment.  During this evaluation, patient reports that he broke up with his girlfriend 1 week ago, and since then, he has been having some intrusive thoughts about killing himself with plans to drown in the bathtub or lay on the railroad track to be run over by a train.  Reports he cannot stop thinking about the ex-girlfriend and felt guilty for yelling at her to  make her leave.  Report history of inpatient psychiatric hospitalization about 3 times with most recent at [facility] child and adolescent unit in March 4th,  2023.  He denies being followed by an outpatient therapist, however, has seeing Dr. Akintayo  a psychiatrist for medication management and psychiatric care.  Denies history of abuse, illicit drug use, alcohol drinking, smoking, vaping, or marijuana use.  Endorses highest education level as a high school diploma.  Denies being married, denies having children, denies legal issues, denies affiliation with the [employer].  Reports currently being employed at [employer] in [place].  Report he lives at the [facility] group home in [place], [place] .  Reports he is single, and does not have any children.  Patient endorses symptoms of depression to include depressed mood, anhedonia, insomnia, psychomotor agitation, fatigue, guilty, anxiety, and loss of energy.  Reports symptoms of Anxiety as shakiness, increased heart rate, worrying too much, and increased agitation.  Reports symptoms of OCD as intrusive thought and repeating activities over and over again.  Identifies girlfriend leaving him, could not be 'shaken' off from his head.  He denies symptoms of psychosis including delusions, hallucination, ideas of reference, paranoia, or grandiose.  Objective: Patient presents alert, cooperative, and oriented to person, time, place, and situation.  Chart reviewed and findings shared with the treatment team and consulted attending psychiatrist with recommendations to continue current treatment plan as already in progress.  Speech is coherent, but slightly difficult to understand.  Increased psychomotor agitation observed during this assessment.  He is objectively not responding to internal or external stimuli.  Denies delusional thinking, ideas of reference, or paranoia.  He further denies SI, HI, or AVH during this evaluation.  Vital signs reviewed without critical values with pulse of 82, and BP 141/60.  Labs and EKG reviewed as indicated in the treatment plan.  Patient is admitted for mood stabilization, medication management, and safety.  Mode  of transport to Hospital: Safe  transport Current Outpatient (Home) Medication List: See home medication listing PRN medication prior to evaluation: See home medication listing  ED course: Labs and EKG were obtain and analyze Collateral Information: None obtained at this time POA/Legal Guardian:  Past Psychiatric Hx: Previous Psych Diagnoses: Autism spectrum disorder, ADHD, MDD, GAD. Prior inpatient treatment: Yes 3 times. One time at [facility] and child and adolescent unit Current/prior outpatient treatment: Currently being followed by Dr. Akintayo a psychiatrist Prior rehab hx: Denies Psychotherapy hx: Yes History of suicide: Yes History of homicide or aggression: Denies Psychiatric medication history: Patient has been on trial clonidine , Focalin  XR, Luvox , Prozac  and Zoloft Psychiatric medication compliance history: Reports compliance Neuromodulation history: Denies Current Psychiatrist: Dr. Sable Current therapist: Denies  Substance Abuse Hx: Alcohol: Denies use Tobacco: Denies use Illicit drugs: Denies use Rx drug abuse: Denies Rehab hx: Denies  Past Medical History: Medical Diagnoses: History of asthma, scoliosis. Home Rx: Yes, see MAR Prior Hosp: Denies. Prior Surgeries/Trauma: Denies Head trauma, LOC, concussions, seizures: Denies history of seizures Allergies: Peanut  Oil  Drug Ingredient Anaphylaxis, Other (See Comments) High Allergy   Past Updates  By allergy  testing only - followed by Dr. Vinie Fluke Allergy   Drug Ingredient, Food Other (See Comments) Not Specified  09/21/2021 Past Updates  Per mother -unknown  Dust Mite Extract  Drug Ingredient Itching Low Allergy  01/13/2020 Past Updates    Egg Protein (Egg White)  Drug Ingredient Itching, Rash, Other (See Comments) Low Allergy  09/14/2012 Past Updates  By allergy  testing  Egg Protein-containing Drug Products  Drug Class, Food Itching, Rash Low Allergy  09/14/2012 Past Updates    Other   Itching, Rash, Other (See Comments) Low Allergy  09/14/2012 Past  Updates  ALL tree nuts  Shellfish Allergy   Drug Ingredient, Food Itching, Rash, Other (See Comments) Low Allergy  09/14/2012 Past Updates   LMP: N/A Contraception: N/A PCP: Denies  Family History: Medical: Denies Psych: Denies Psych Rx: Denies SA/HA: Denies Substance use family hx: Denies  Social History: Childhood (bring, raised, lives now, parents, siblings, schooling, education): High school diploma Abuse: Denies history of abuse Marital Status: Single Sexual orientation: Male from birth  Children: 0 Employment: Employed at [employer] Peer Group: Denies peer group Housing: Lives at [facility]  Group Home Finances: Some financial difficulty Legal: Denies Special Educational Needs Teacher: Denies affiliation with the [employer].  Associated Signs/Symptoms: Depression Symptoms:  depressed mood, anhedonia, insomnia, psychomotor agitation, fatigue, feelings of worthlessness/guilt, hopelessness, anxiety, loss of energy/fatigue, (Hypo) Manic Symptoms:  Denies Anxiety Symptoms:  Excessive Worry, Psychotic Symptoms:  Denies symptoms PTSD Symptoms: NA  Did the patient present with any abnormal findings indicating the need for additional neurological or psychological testing?  No  Total Time spent with patient: 1.5 hours   Is the patient at risk to self? Yes.    Has the patient been a risk to self in the past 6 months? Yes.    Has the patient been a risk to self within the distant past? Yes.    Is the patient a risk to others? No.  Has the patient been a risk to others in the past 6 months? No.  Has the patient been a risk to others within the distant past? No.   Columbia Scale:  Flowsheet Row [facility]) [facility] from [facility]/07/2024 in [facility] ED from [facility]08/20[facility] in [facility]  C-SSRS RISK CATEGORY Moderate  Risk Moderate Risk Low Risk      Alcohol Screening: Patient refused Alcohol Screening Tool: Yes 1. How often do you have a drink containing alcohol?: Never 2. How many drinks containing alcohol do you have on a typical day when you are drinking?: 1 or 2 3. How often do you have six or more drinks on one occasion?: Never AUDIT-C Score: 0 4. How often during the last year have you found that you were not able to stop drinking once you had started?: Never 5. How often during the last year have you failed to do what was normally expected from you because of drinking?: Never 6. How often during the last year have you needed a first drink in the morning to get yourself going after a heavy drinking session?: Never 7. How often during the last year have you had a feeling of guilt of remorse after drinking?: Never 8. How often during the last year have you been unable to remember what happened the night before because you had been drinking?: Never 9. Have you or someone else been injured as a result of your drinking?: No 10. Has a relative or friend or a doctor or another health worker been concerned about your drinking or suggested you cut down?: No Alcohol Use Disorder Identification Test Final Score (AUDIT): 0 Alcohol Brief Interventions/Follow-up: Patient Refused  Substance Abuse History in the last 12 months:  No. Consequences of Substance Abuse: NA Previous Psychotropic Medications: Yes  Psychological Evaluations: Yes  Past Medical History:  Past Medical History:  Diagnosis Date   ADHD (attention deficit hyperactivity disorder)    Allergy     Angio-edema    Anxiety    Asthma    Attention deficit disorder (ADD), child, with hyperactivity    Autism    Eczema    Food allergy     OCD (obsessive compulsive disorder)    Scoliosis    Urticaria     Past Surgical History:  Procedure Laterality Date   ADENOIDECTOMY     ADENOIDECTOMY     SPINAL FUSION     03/2021 per mother   TYMPANOSTOMY TUBE PLACEMENT     Family  History:  Family History  Problem Relation Age of Onset   Myocarditis Brother        14 months   Rheum arthritis Maternal Aunt    Stroke Maternal Grandmother    Diabetes type II Maternal Grandmother    Heart disease Maternal Grandfather    Bowel Disease Maternal Grandfather    Cancer Neg Hx    Heart failure Neg Hx    Hyperlipidemia Neg Hx    Hypertension Neg Hx    Tobacco Screening: Tobacco Use History[1]  BH Tobacco Counseling     Are you interested in Tobacco Cessation Medications?  No, patient refused Counseled patient on smoking cessation:  N/A, patient does not use tobacco products Reason Tobacco Screening Not Completed: No value filed.    Social History:  Social History   Substance and Sexual Activity  Alcohol Use No     Social History   Substance and Sexual Activity  Drug Use No    Additional Social History:   Allergies:  Allergies[2] Lab Results:  Results for orders placed or performed during the hospital encounter of 08/07/24 (from the past 48 hours)  CBC with Differential/Platelet     Status: Abnormal   Collection Time: 08/07/24  7:53 PM  Result Value Ref Range   WBC 8.9 4.0 - 10.5 K/uL  RBC 4.69 4.22 - 5.81 MIL/uL   Hemoglobin 14.2 13.0 - 17.0 g/dL   HCT 56.4 60.9 - 47.9 %   MCV 92.8 80.0 - 100.0 fL   MCH 30.3 26.0 - 34.0 pg   MCHC 32.6 30.0 - 36.0 g/dL   RDW 87.7 88.4 - 84.4 %   Platelets 444 (H) 150 - 400 K/uL   nRBC 0.0 0.0 - 0.2 %   Neutrophils Relative % 58 %   Neutro Abs 5.3 1.7 - 7.7 K/uL   Lymphocytes Relative 30 %   Lymphs Abs 2.6 0.7 - 4.0 K/uL   Monocytes Relative 6 %   Monocytes Absolute 0.6 0.1 - 1.0 K/uL   Eosinophils Relative 4 %   Eosinophils Absolute 0.3 0.0 - 0.5 K/uL   Basophils Relative 1 %   Basophils Absolute 0.1 0.0 - 0.1 K/uL   Immature Granulocytes 1 %   Abs Immature Granulocytes 0.07 0.00 - 0.07 K/uL    Comment: Performed at [facility], [address] [place], [place] [address]  Comprehensive metabolic  panel     Status: Abnormal   Collection Time: 08/07/24  7:53 PM  Result Value Ref Range   Sodium 143 135 - 145 mmol/L   Potassium 3.6 3.5 - 5.1 mmol/L   Chloride 103 98 - 111 mmol/L   CO2 26 22 - 32 mmol/L   Glucose, Bld 107 (H) 70 - 99 mg/dL    Comment: Glucose reference range applies only to samples taken after fasting for at least 8 hours.   BUN 6 6 - 20 mg/dL   Creatinine, Ser 8.89 0.61 - 1.24 mg/dL   Calcium 9.7 8.9 - 89.6 mg/dL   Total Protein 7.9 6.5 - 8.1 g/dL   Albumin 4.7 3.5 - 5.0 g/dL   AST 22 15 - 41 U/L   ALT 25 0 - 44 U/L   Alkaline Phosphatase 113 38 - 126 U/L   Total Bilirubin 0.6 0.0 - 1.2 mg/dL   GFR, Estimated >39 >39 mL/min    Comment: (NOTE) Calculated using the CKD-EPI Creatinine Equation (2021)    Anion gap 14 5 - 15    Comment: Performed at [facility], [address], [place], [place] [address]  Ethanol     Status: None   Collection Time: 08/07/24  7:53 PM  Result Value Ref Range   Alcohol, Ethyl (B) <15 <15 mg/dL    Comment: (NOTE) For medical purposes only. Performed at [facility], [address], [place], [place] [address]   TSH     Status: None   Collection Time: 08/07/24  7:53 PM  Result Value Ref Range   TSH 0.876 0.350 - 4.500 uIU/mL    Comment: Performed at [facility], [address] [place], [place] [address]  VITAMIN D  25 Hydroxy (Vit-D Deficiency, Fractures)     Status: Abnormal   Collection Time: 08/07/24  7:53 PM  Result Value Ref Range   Vit D, 25-Hydroxy 12.7 (L) 30 - 100 ng/mL    Comment: (NOTE) Vitamin D  deficiency has been defined by the Institute of Medicine  and an Endocrine Society practice guideline as a level of serum 25-OH  vitamin D  less than 20 ng/mL (1,2). The Endocrine Society went on to  further define vitamin D  insufficiency as a level between 21 and 29  ng/mL (2).  1. IOM (Institute of Medicine). 2010. Dietary reference intakes for  calcium and D. [place]: The [employer]. 2. Holick MF, Binkley Mokuleia, Bischoff-Ferrari HA, et al. Evaluation,  treatment, and prevention of vitamin D  deficiency: an Endocrine  Society clinical practice guideline, JCEM. 2011 Jul; 96(7): 1911-30.  Performed at [facility], [address], [place], [place] [address]   Acetaminophen  level     Status: Abnormal   Collection Time: 08/07/24  7:53 PM  Result Value Ref Range   Acetaminophen  (Tylenol ), Serum <10 (L) 10 - 30 ug/mL    Comment: (NOTE) Toxic concentrations can be more effectively related to post dose interval; >200, >100, and >50 ug/mL serum concentrations correspond to toxic concentrations at 4, 8, and 12 hours post dose, respectively.  Performed at [facility], [address] [place], [place] [address]   Salicylate level     Status: Abnormal   Collection Time: 08/07/24  7:53 PM  Result Value Ref Range   Salicylate Lvl <7.0 (L) 7.0 - 30.0 mg/dL    Comment: Performed at [facility], [address], [place], [place] [address]  Urinalysis, Routine w reflex microscopic -Urine, Clean Catch     Status: Abnormal   Collection Time: 08/07/24  7:55 PM  Result Value Ref Range   Color, Urine YELLOW YELLOW   APPearance CLEAR CLEAR   Specific Gravity, Urine 1.016 1.005 - 1.030   pH 7.0 5.0 - 8.0   Glucose, UA 50 (A) NEGATIVE mg/dL   Hgb urine dipstick NEGATIVE NEGATIVE   Bilirubin Urine NEGATIVE NEGATIVE   Ketones, ur NEGATIVE NEGATIVE mg/dL   Protein, ur NEGATIVE NEGATIVE mg/dL   Nitrite NEGATIVE NEGATIVE   Leukocytes,Ua NEGATIVE NEGATIVE    Comment: Performed at [facility], [address] [place], [place] [address]  POCT Urine Drug Screen - (I-Screen)     Status: Normal   Collection Time: 08/07/24  7:56 PM  Result Value Ref Range   POC Amphetamine UR None Detected NONE DETECTED (Cut Off Level 1000 ng/mL)   POC Secobarbital (BAR) None Detected NONE DETECTED (Cut Off Level 300 ng/mL)   POC Buprenorphine (BUP) None Detected NONE DETECTED  (Cut Off Level 10 ng/mL)   POC Oxazepam (BZO) None Detected NONE DETECTED (Cut Off Level 300 ng/mL)   POC Cocaine UR None Detected NONE DETECTED (Cut Off Level 300 ng/mL)   POC Methamphetamine UR None Detected NONE DETECTED (Cut Off Level 1000 ng/mL)   POC Morphine None Detected NONE DETECTED (Cut Off Level 300 ng/mL)   POC Methadone UR None Detected NONE DETECTED (Cut Off Level 300 ng/mL)   POC Oxycodone UR None Detected NONE DETECTED (Cut Off Level 100 ng/mL)   POC Marijuana UR None Detected NONE DETECTED (Cut Off Level 50 ng/mL)   Blood Alcohol level:  Lab Results  Component Value Date   [facility] <15 08/07/2024   ETH <10 11/08/2023   Metabolic Disorder Labs:  Lab Results  Component Value Date   HGBA1C 4.5 (L) 11/08/2023   MPG 82.45 11/08/2023   MPG 91.06 09/24/2021   Lab Results  Component Value Date   PROLACTIN 25.9 11/08/2023   PROLACTIN 13.5 09/24/2021   Lab Results  Component Value Date   CHOL 161 11/08/2023   TRIG 99 11/08/2023   HDL 49 11/08/2023   CHOLHDL 3.3 11/08/2023   VLDL 20 11/08/2023   LDLCALC 92 11/08/2023   LDLCALC 58 09/24/2021   Current Medications: Current Facility-Administered Medications  Medication Dose Route Frequency Provider Last Rate Last Admin   acetaminophen  (TYLENOL ) tablet 650 mg  650 mg Oral Q6H PRN Hobson, Fran E, NP       alum & mag hydroxide-simeth (MAALOX/MYLANTA) 200-200-20 MG/5ML suspension 30 mL  30 mL Oral Q4H PRN Hobson, Fran E, NP       cloNIDine  (CATAPRES ) tablet 0.1 mg  0.1 mg Oral BID Hobson, Fran E, NP   0.1 mg at 08/09/24 9070   dexmethylphenidate  (FOCALIN  XR) 24 hr capsule 25 mg  25 mg Oral Daily Hobson, Fran E, NP   25 mg at 08/09/24 9070   haloperidol  (HALDOL ) tablet 5 mg  5 mg Oral TID PRN Hobson, Fran E, NP       And   diphenhydrAMINE  (BENADRYL ) capsule 50 mg  50 mg Oral TID PRN Hobson, Fran E, NP       haloperidol  lactate (HALDOL ) injection 5 mg  5 mg Intramuscular TID PRN Hobson, Fran E, NP       And   diphenhydrAMINE   (BENADRYL ) injection 50 mg  50 mg Intramuscular TID PRN Hobson, Fran E, NP       And   LORazepam  (ATIVAN ) injection 2 mg  2 mg Intramuscular TID PRN Hobson, Fran E, NP       haloperidol  lactate (HALDOL ) injection 10 mg  10 mg Intramuscular TID PRN Hobson, Fran E, NP       And   diphenhydrAMINE  (BENADRYL ) injection 50 mg  50 mg Intramuscular TID PRN Hobson, Fran E, NP       And   LORazepam  (ATIVAN ) injection 2 mg  2 mg Intramuscular TID PRN Hobson, Fran E, NP       fluvoxaMINE  (LUVOX ) tablet 150 mg  150 mg Oral BID Hobson, Fran E, NP   150 mg at 08/09/24 9070   magnesium  hydroxide (MILK OF MAGNESIA) suspension 30 mL  30 mL Oral Daily PRN Hobson, Fran E, NP       PTA Medications: Medications Prior to Admission  Medication Sig Dispense Refill Last Dose/Taking   acetaminophen  (TYLENOL ) 325 MG tablet Take 650 mg by mouth every 6 (six) hours as needed for headache.   Taking As Needed   Azelastine -Fluticasone  137-50 MCG/ACT SUSP Place 1 spray into the nose daily.   Past Week   budesonide -formoterol  (SYMBICORT ) 160-4.5 MCG/ACT inhaler Inhale 2 puffs into the lungs 2 (two) times daily. 1 each 12 08/07/2024 Evening   cloNIDine  (CATAPRES ) 0.1 MG tablet Take 0.1 mg by mouth 2 (two) times daily.   08/07/2024 Evening   Dexmethylphenidate  HCl 25 MG CP24 Take 1 capsule by mouth every morning.   08/07/2024 Morning   fluticasone  (FLOVENT  HFA) 110 MCG/ACT inhaler Inhale 2 puffs into the lungs 2 (two) times daily.   Past Week   fluvoxaMINE  (LUVOX ) 100 MG tablet Take 150 mg by mouth 2 (two) times daily.   08/07/2024 Evening   levocetirizine (XYZAL ) 5 MG tablet TAKE ONE TABLET BY MOUTH EVERY EVENING 30 tablet 5 08/07/2024 Evening   albuterol  (VENTOLIN  HFA) 108 (90 Base) MCG/ACT inhaler Inhale 2 puffs into the lungs every 4 (four) hours as needed for wheezing or shortness of breath. 18 each 1    EPINEPHrine  0.3 mg/0.3 mL IJ SOAJ injection Inject 0.3 mg into the muscle as needed for anaphylaxis. 2 each 2    AIMS:  ,  ,   ,  ,  ,  ,    Musculoskeletal: Strength & Muscle Tone: within normal limits Gait & Station: normal Patient leans: N/A  Psychiatric Specialty Exam:  Presentation  General Appearance:  Fairly Groomed  Eye Contact: Fair  Speech: Clear and Coherent  Speech Volume: Normal  Handedness: Right  Mood and Affect  Mood: Anxious; Depressed; Hopeless  Affect: Depressed; Congruent  Thought Process  Thought Processes: Coherent  Duration of Psychotic Symptoms:N/A Past Diagnosis of Schizophrenia or Psychoactive disorder: No  Descriptions of Associations:Intact  Orientation:Full (Time, Place and Person)  Thought Content:Logical  Hallucinations:Hallucinations: None  Ideas of Reference:None  Suicidal Thoughts:Suicidal Thoughts: Yes, Passive SI Active Intent and/or Plan: Without Intent; Without Plan; Without Means to Carry Out; Without Access to Means SI Passive Intent and/or Plan: Without Intent; Without Plan; Without Means to Carry Out; Without Access to Means  Homicidal Thoughts:Homicidal Thoughts: No  Sensorium  Memory: Immediate Good; Recent Good  Judgment: Poor  Insight: Poor  Executive Functions  Concentration: Fair  Attention Span: Good  Recall: Good  Fund of Knowledge: Good  Language: Good  Psychomotor Activity  Psychomotor Activity: Psychomotor Activity: Normal  Assets  Assets: Communication Skills; Desire for Improvement; Physical Health; Resilience  Sleep  Sleep: Sleep: Good  Estimated Sleeping Duration (Last 24 Hours): 5.25-6.25 hours  Physical Exam: Physical Exam Vitals and nursing note reviewed.  Constitutional:      General: He is not in acute distress.    Appearance: He is not ill-appearing.  HENT:     Head: Normocephalic.     Right Ear: External ear normal.     Left Ear: External ear normal.     Nose: Nose normal.     Mouth/Throat:     Mouth: Mucous membranes are moist.     Pharynx: Oropharynx is clear.  Eyes:      Extraocular Movements: Extraocular movements intact.  Cardiovascular:     Rate and Rhythm: Bradycardia present.  Pulmonary:     Effort: Pulmonary effort is normal. No respiratory distress.  Musculoskeletal:     Cervical back: Normal range of motion.  Skin:    General: Skin is dry.  Neurological:     General: No focal deficit present.     Mental Status: He is oriented to person, place, and time.  Psychiatric:        Mood and Affect: Mood normal.        Behavior: Behavior normal.    Review of Systems  Constitutional:  Negative for chills and fever.  HENT:  Negative for sore throat.   Eyes:  Negative for blurred vision.  Respiratory:  Negative for cough, sputum production, shortness of breath and wheezing.   Cardiovascular:  Negative for chest pain and palpitations.  Gastrointestinal:  Negative for abdominal pain, constipation, diarrhea, heartburn, nausea and vomiting.  Genitourinary:  Negative for dysuria.  Musculoskeletal:  Negative for falls.  Skin:  Negative for itching and rash.  Neurological:  Negative for dizziness and headaches.  Endo/Heme/Allergies: Negative.   Psychiatric/Behavioral:  Positive for depression and suicidal ideas. Negative for hallucinations and substance abuse. The patient is nervous/anxious. The patient does not have insomnia.    Blood pressure (!) 141/60, pulse 82, temperature 97.8 F (36.6 C), temperature source Oral, resp. rate 18, height 5' 6 (1.676 m), weight 84.8 kg, SpO2 99%. Body mass index is 30.18 kg/m.  Treatment Plan Summary: Daily contact with patient to assess and evaluate symptoms and progress in treatment and Medication management  Observation Level/Precautions:  15 minute checks  Laboratory:   CBC: Platelets 444, otherwise normal Chemistry Profile: Glucose 107, otherwise normal Folic Acid: N/A GGT: N/A HbAIC: N/A HCG: N/A UDS: No substances dictated UA: TSH: 0.876, within normal limits BAL:  Less than 15 Vitamin D   25-hydroxy: 12.7; vitamin D  50,000 units ordered q 7 days for 7 doses. 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 from [facility] behavioral health urgent care for worsening depression, SI with plans to drown himself in the bathtub or lie on a railroad track to be ran over by a train in the context of break-up with Girlfriend a week ago.  Patient has PMHx  significant for asthma, scoliosis with surgical correction in 2022.  BAL less than 15, UDS negative for any substances.   Physician Treatment Plan for Primary Diagnosis: MDD (major depressive disorder), recurrent severe, without psychosis (HCC)  Plans: Medications: --Prozac  10 mg p.o. daily for depression and anxiety.  May titrate as clinically required. --Continue clonidine  0.1 mg p.o. twice daily for ADHD --Continue Focalin  XR 25 mg p.o. daily for ADHD --Continue Luvox  150 mg p.o. twice daily for OCD  --Continue trazodone  tablets 50 mg p.o. q. nightly as needed for insomnia -- Continue hydroxyzine  tablet 25 mg p.o. 3 times daily as needed for anxiety  Continue [facility] agitation protocol as recommended  Medication for other medical problems:  Other PRN Medications  -Acetaminophen  650 mg every 6 as needed/mild pain  -Maalox 30 mL oral every 4 as needed/digestion  -Magnesium  hydroxide 30 mL daily as needed/mild constipation   --The risks/benefits/side-effects/alternatives to this medication were discussed in detail with the patient and time was given for  questions. The patient consents to medication trial.   -- Metabolic profile and EKG monitoring obtained while on an atypical antipsychotic (BMI: Lipid Panel: HbgA1c: QTc:)   -- Encouraged patient to participate in unit milieu and in scheduled group therapies   Safety and Monitoring:  Voluntary admission to inpatient psychiatric unit for safety, stabilization and treatment  Daily contact with patient to assess and evaluate symptoms and progress in treatment  Patient's case to be discussed in multi-disciplinary team meeting  Observation Level : q15 minute checks  Vital signs: q12 hours  Precautions: suicide, but pt currently verbally contracts for safety on unit?   Discharge Planning:  Social work and case management to assist with discharge planning and identification of hospital follow-up needs prior to discharge  Estimated LOS: 5-7?days  Discharge Concerns: Need to establish Safety plan; Medication compliance and effectiveness  Discharge Goals: Return home with outpatient referrals for mental health follow-up including medication management/psychotherapy.   Long Term Goal(s): Improvement in symptoms so as ready for discharge  Short Term Goals: Ability to identify changes in lifestyle to reduce recurrence of condition will improve, Ability to verbalize feelings will improve, Ability to disclose and discuss suicidal ideas, Ability to demonstrate self-control will improve, Ability to identify and develop effective coping behaviors will improve, Ability to maintain clinical measurements within normal limits will improve, Compliance with prescribed medications will improve, and Ability to identify triggers associated with substance abuse/mental health issues will improve  Physician Treatment Plan for Secondary Diagnosis: Principal Problem:   MDD (major depressive disorder), recurrent severe, without psychosis (HCC)  I certify that inpatient services furnished can reasonably be expected to  improve the patient's condition.    Ellouise Randy Azure, FNP 1/19/202610:17 AM     [1]  Social History Tobacco Use  Smoking Status Never  Smokeless Tobacco Never  [2]  Allergies Allergen Reactions   Peanut  Oil Anaphylaxis and Other (See Comments)    By allergy  testing only - followed by Dr. Vinie Fluke Allergy  Other (See Comments)    Per mother -unknown   Dust Mite Extract Itching   Egg Protein (Egg White) Itching, Rash and Other (See Comments)    By allergy  testing   Egg Protein-Containing Drug Products Itching and Rash   Other Itching, Rash and Other (See Comments)    ALL tree nuts   Shellfish Allergy  Itching, Rash and Other (See Comments)    By allergy  testing   "

## 2024-08-09 NOTE — Plan of Care (Signed)
   Problem: Activity: Goal: Interest or engagement in activities will improve Outcome: Progressing Goal: Sleeping patterns will improve Outcome: Progressing   Problem: Safety: Goal: Periods of time without injury will increase Outcome: Progressing   Problem: Education: Goal: Emotional status will improve Outcome: Not Progressing Goal: Mental status will improve Outcome: Not Progressing

## 2024-08-09 NOTE — Progress Notes (Signed)
 Pt did attend rec therapy

## 2024-08-09 NOTE — Progress Notes (Signed)
 Pt did not attend grief and loss session

## 2024-08-09 NOTE — Group Note (Signed)
 Date:  08/09/2024 Time:  9:55 AM  Group Topic/Focus:  Goals Group:   The focus of this group is to help patients establish daily goals to achieve during treatment and discuss how the patient can incorporate goal setting into their daily lives to aide in recovery.    Participation Level:  Active  Participation Quality:  Appropriate  Affect:  Appropriate  Cognitive:  Appropriate  Insight: Appropriate  Engagement in Group:  Engaged  Modes of Intervention:  Discussion  Additional Comments:  to stay awake  Nat Rummer 08/09/2024, 9:55 AM

## 2024-08-09 NOTE — Progress Notes (Addendum)
(  Sleep Hours) - 6 (Any PRNs that were needed, meds refused, or side effects to meds)- none (Any disturbances and when (visitation, over night)-none (Concerns raised by the patient)- none (SI/HI/AVH)- denies

## 2024-08-09 NOTE — [facility] Suicide Risk Assessment (Signed)
 Suicide Risk Assessment  Admission Assessment    [name] Admission Suicide Risk Assessment   Nursing information obtained from:  Patient Demographic factors:  Male, Low socioeconomic status, Adolescent or young adult Current Mental Status:  Self-harm thoughts Loss Factors:  Loss of significant relationship Historical Factors:  Prior suicide attempts Risk Reduction Factors:  Employed  Total Time spent with patient: 45 minutes Principal Problem: MDD (major depressive disorder), recurrent severe, without psychosis (HCC) Diagnosis:  Principal Problem:   MDD (major depressive disorder), recurrent severe, without psychosis (HCC)  Subjective Data: Randy Lane is a 19 year old African-American male with prior psychiatric diagnoses significant for major depressive disorder without psychosis, ADHD, GAD, autism spectrum disorder, DMDD, and OCD who presents voluntarily to [facility] from [facility] behavioral health urgent care for worsening depression, SI with plans to drown himself in the bathtub or lie on a railroad track to be ran over by a train in the context of break-up with Girlfriend a week ago.  Patient has PMHx  significant for asthma, scoliosis with surgical correction in 2022.  BAL less than 15, UDS negative for any substances.   Continued Clinical Symptoms:  Alcohol Use Disorder Identification Test Final Score (AUDIT): 0 The Alcohol Use Disorders Identification Test, Guidelines for Use in Primary Care, Second Edition.  World Science Writer [facility]). Score between 0-7:  no or low risk or alcohol related problems. Score between 8-15:  moderate risk of alcohol related problems. Score between 16-19:  high risk of alcohol related problems. Score 20 or above:  warrants further diagnostic evaluation for alcohol dependence and treatment.  CLINICAL FACTORS:   Severe Anxiety and/or Agitation Depression:   Anhedonia Hopelessness Impulsivity Severe More  than one psychiatric diagnosis Previous Psychiatric Diagnoses and Treatments Medical Diagnoses and Treatments/Surgeries  Musculoskeletal: Strength & Muscle Tone: within normal limits Gait & Station: normal Patient leans: N/A  Psychiatric Specialty Exam:  Presentation  General Appearance:  Fairly Groomed  Eye Contact: Fair  Speech: Clear and Coherent  Speech Volume: Normal  Handedness: Right  Mood and Affect  Mood: Anxious; Depressed; Hopeless  Affect: Depressed; Congruent  Thought Process  Thought Processes: Coherent  Descriptions of Associations:Intact  Orientation:Full (Time, Place and Person)  Thought Content:Logical  History of Schizophrenia/Schizoaffective disorder:No  Duration of Psychotic Symptoms:No data recorded Hallucinations:Hallucinations: None  Ideas of Reference:None  Suicidal Thoughts:Suicidal Thoughts: Yes, Passive SI Active Intent and/or Plan: Without Intent; Without Plan; Without Means to Carry Out; Without Access to Means SI Passive Intent and/or Plan: Without Intent; Without Plan; Without Means to Carry Out; Without Access to Means  Homicidal Thoughts:Homicidal Thoughts: No  Sensorium  Memory: Immediate Good; Recent Good  Judgment: Poor  Insight: Poor  Executive Functions  Concentration: Fair  Attention Span: Good  Recall: Good  Fund of Knowledge: Good  Language: Good  Psychomotor Activity  Psychomotor Activity: Psychomotor Activity: Normal  Assets  Assets: Communication Skills; Desire for Improvement; Physical Health; Resilience  Sleep  Sleep: Sleep: Good Number of Hours of Sleep: 6  Physical Exam: Physical Exam Vitals and nursing note reviewed.  Constitutional:      General: He is not in acute distress.    Appearance: He is not ill-appearing.  HENT:     Head: Normocephalic.     Right Ear: External ear normal.     Left Ear: External ear normal.     Nose: Nose normal.     Mouth/Throat:      Mouth: Mucous membranes are moist.  Pharynx: Oropharynx is clear.  Eyes:     Extraocular Movements: Extraocular movements intact.  Cardiovascular:     Rate and Rhythm: Bradycardia present.  Pulmonary:     Effort: Pulmonary effort is normal. No respiratory distress.  Musculoskeletal:        General: Normal range of motion.     Cervical back: Normal range of motion.  Skin:    General: Skin is dry.  Neurological:     General: No focal deficit present.     Mental Status: He is alert and oriented to person, place, and time.  Psychiatric:        Mood and Affect: Mood normal.        Behavior: Behavior normal.    Review of Systems  Constitutional:  Negative for chills and fever.  HENT:  Negative for sore throat.   Eyes:  Negative for blurred vision.  Respiratory:  Negative for cough, sputum production, shortness of breath and wheezing.   Cardiovascular:  Negative for chest pain and palpitations.  Gastrointestinal:  Negative for heartburn and nausea.  Genitourinary:  Negative for dysuria.  Musculoskeletal:  Negative for falls.  Skin:  Negative for itching and rash.  Neurological:  Negative for dizziness, seizures and headaches.  Endo/Heme/Allergies: Negative.   Psychiatric/Behavioral:  Positive for depression. Negative for hallucinations, substance abuse and suicidal ideas. The patient is nervous/anxious. The patient does not have insomnia.    Blood pressure (!) 141/60, pulse 82, temperature 97.8 F (36.6 C), temperature source Oral, resp. rate 18, height 5' 6 (1.676 m), weight 84.8 kg, SpO2 99%. Body mass index is 30.18 kg/m.  COGNITIVE FEATURES THAT CONTRIBUTE TO RISK:  Polarized thinking    SUICIDE RISK:   Severe:  Frequent, intense, and enduring suicidal ideation, specific plan, no subjective intent, but some objective markers of intent (i.e., choice of lethal method), the method is accessible, some limited preparatory behavior, evidence of impaired self-control, severe  dysphoria/symptomatology, multiple risk factors present, and few if any protective factors, particularly a lack of social support.  PLAN OF CARE: Treatment Plan Summary: Daily contact with patient to assess and evaluate symptoms and progress in treatment and Medication management  Observation Level/Precautions:  15 minute checks  Laboratory:   CBC: Platelets 444, otherwise normal Chemistry Profile: Glucose 107, otherwise normal Folic Acid: N/A GGT: N/A HbAIC: N/A HCG: N/A UDS: No substances dictated UA: TSH: 0.876, within normal limits BAL: Less than 15 Vitamin D  25-hydroxy: 12.7; vitamin D  50,000 units ordered q 7 days for 7 doses. Vitamin B-12: Within normal limits  EKG: Normal sinus rhythm, ventricular rate 79, QT/QTc 352/403  Psychotherapy: Therapeutic milieu  Medications: See MAR  Consultations: N/A  Discharge Concerns: Safety  Estimated LOS: 5 to 7 days  Other:     Assessment: Randy Lane is a 19 year old African-American male with prior psychiatric diagnoses significant for major depressive disorder without psychosis, ADHD, GAD, autism spectrum disorder, DMDD, and OCD who presents voluntarily to [facility] from [facility] behavioral health urgent care for worsening depression, SI with plans to drown himself in the bathtub or lie on a railroad track to be ran over by a train in the context of break-up with Girlfriend a week ago.  Patient has PMHx  significant for asthma, scoliosis with surgical correction in 2022.  BAL less than 15, UDS negative for any substances.   Physician Treatment Plan for Primary Diagnosis: MDD (major depressive disorder), recurrent severe, without psychosis (HCC)  Plans: Medications: --Prozac  10  mg p.o. daily for depression and anxiety.  May titrate as clinically required. --Continue clonidine  0.1 mg p.o. twice daily for ADHD --Continue Focalin  XR 25 mg p.o. daily for ADHD --Continue Luvox  150 mg p.o. twice daily  for OCD  --Continue trazodone  tablets 50 mg p.o. q. nightly as needed for insomnia -- Continue hydroxyzine  tablet 25 mg p.o. 3 times daily as needed for anxiety  Continue [facility]HH agitation protocol as recommended  Medication for other medical problems:  Other PRN Medications  -Acetaminophen  650 mg every 6 as needed/mild pain  -Maalox 30 mL oral every 4 as needed/digestion  -Magnesium  hydroxide 30 mL daily as needed/mild constipation   --The risks/benefits/side-effects/alternatives to this medication were discussed in detail with the patient and time was given for questions. The patient consents to medication trial.   -- Metabolic profile and EKG monitoring obtained while on an atypical antipsychotic (BMI: Lipid Panel: HbgA1c: QTc:)   -- Encouraged patient to participate in unit milieu and in scheduled group therapies   Safety and Monitoring:  Voluntary admission to inpatient psychiatric unit for safety, stabilization and treatment  Daily contact with patient to assess and evaluate symptoms and progress in treatment  Patient's case to be discussed in multi-disciplinary team meeting  Observation Level : q15 minute checks  Vital signs: q12 hours  Precautions: suicide, but pt currently verbally contracts for safety on unit?   Discharge Planning:  Social work and case management to assist with discharge planning and identification of hospital follow-up needs prior to discharge  Estimated LOS: 5-7?days  Discharge Concerns: Need to establish Safety plan; Medication compliance and effectiveness  Discharge Goals: Return home with outpatient referrals for mental health follow-up including medication management/psychotherapy.   Long Term Goal(s): Improvement in symptoms so as ready for discharge  Short Term Goals: Ability to identify changes in lifestyle to reduce recurrence of condition will improve, Ability to verbalize feelings will improve, Ability to disclose and discuss suicidal ideas,  Ability to demonstrate self-control will improve, Ability to identify and develop effective coping behaviors will improve, Ability to maintain clinical measurements within normal limits will improve, Compliance with prescribed medications will improve, and Ability to identify triggers associated with substance abuse/mental health issues will improve  Physician Treatment Plan for Secondary Diagnosis: Principal Problem:   MDD (major depressive disorder), recurrent severe, without psychosis (HCC)  I certify that inpatient services furnished can reasonably be expected to improve the patient's condition.   Kamryn Messineo C Christne Platts, FNP 08/09/2024, 10:14 AM

## 2024-08-09 NOTE — Group Note (Unsigned)
 Date:  08/09/2024 Time:  9:30 PM  Group Topic/Focus:  Alcoholics Anonymous (AA) Meeting    Participation Level:  {BHH PARTICIPATION OZCZO:77735}  Participation Quality:  {BHH PARTICIPATION QUALITY:22265}  Affect:  {BHH AFFECT:22266}  Cognitive:  {BHH COGNITIVE:22267}  Insight: {BHH Insight2:20797}  Engagement in Group:  {BHH ENGAGEMENT IN HMNLE:77731}  Modes of Intervention:  {BHH MODES OF INTERVENTION:22269}  Additional Comments:  ***  Eward Mace 08/09/2024, 9:30 PM

## 2024-08-09 NOTE — Progress Notes (Signed)
 Pt did attend OT therapy

## 2024-08-09 NOTE — Group Note (Signed)
 Occupational Therapy Group Note   Group Topic:Goal Setting  Group Date: 08/09/2024 Start Time: 1500 End Time: 1545 Facilitators: Dot Dallas MATSU, OT   Group Description: Group encouraged engagement and participation through discussion focused on goal setting. Group members were introduced to goal-setting using the SMART Goal framework, identifying goals as Specific, Measureable, Acheivable, Relevant, and Time-Bound. Group members took time from group to create their own personal goal reflecting the SMART goal template and shared for review by peers and OT.    Therapeutic Goal(s):  Identify at least one goal that fits the SMART framework    Participation Level: Engaged   Participation Quality: Independent   Behavior: Appropriate   Speech/Thought Process: Relevant   Affect/Mood: Appropriate   Insight: Fair   Judgement: Fair      Modes of Intervention: Education  Patient Response to Interventions:  Attentive   Plan: Continue to engage patient in OT groups 2 - 3x/week.  08/09/2024  [place] MATSU Dot, OT  Randy Lane, OT

## 2024-08-09 NOTE — [facility] Suicide Risk Assessment (Signed)
 B[facility] INPATIENT:  Family/Significant Other Suicide Prevention Education  Suicide Prevention Education:  Education Completed; Designer, Jewellery (mom),  (name of family member/significant other) has been identified by the patient as the family member/significant other with whom the patient will be residing, and identified as the person(s) who will aid the patient in the event of a mental health crisis (suicidal ideations/suicide attempt).  With written consent from the patient, the family member/significant other has been provided the following suicide prevention education, prior to the and/or following the discharge of the patient.  The suicide prevention education provided includes the following: Suicide risk factors Suicide prevention and interventions National Suicide Hotline telephone number [facility] assessment telephone number [facility] Emergency Assistance 911 [facility] and/or Residential Mobile Crisis Unit telephone number  Request made of family/significant other to: Remove weapons (e.g., guns, rifles, knives), all items previously/currently identified as safety concern.   Remove drugs/medications (over-the-counter, prescriptions, illicit drugs), all items previously/currently identified as a safety concern.  The family member/significant other verbalizes understanding of the suicide prevention education information provided.  The family member/significant other agrees to remove the items of safety concern listed above.  Derick JONELLE Blanch, LCSW 08/09/2024, 10:41 AM

## 2024-08-10 NOTE — Progress Notes (Signed)
(  Sleep Hours) - 9 (Any PRNs that were needed, meds refused, or side effects to meds)- Haldol , Benadryl  (Any disturbances and when (visitation, over night)-n/a (Concerns raised by the patient)- Had a night terror (SI/HI/AVH)- denies

## 2024-08-10 NOTE — Progress Notes (Signed)
(  Sleep Hours) -7.5 as of 0530 (Any PRNs that were needed, meds refused, or side effects to meds)- none (Any disturbances and when (visitation, over night)-none (Concerns raised by the patient)- none (SI/HI/AVH)- denies all

## 2024-08-10 NOTE — Group Note (Signed)
 Date:  08/10/2024 Time:  10:51 AM  Group Topic/Focus: PET therapy Overcoming Stress:   The focus of this group is to define stress and help patients assess their triggers.    Participation Level:  Did Not Attend   Randy Lane 08/10/2024, 10:51 AM

## 2024-08-10 NOTE — Progress Notes (Signed)
 Pt noticed to be screaming and talking loud waking his room mate up, pt stated he speaks in his sleep a time and he can get very loud, pt medicated with benadryl  50 mg and haldol  5 mg, all PO. Will continue to monitor.

## 2024-08-10 NOTE — Group Note (Signed)
 Recreation Therapy Group Note   Group Topic:Animal Assisted Therapy   Group Date: 08/10/2024 Start Time: 9049 End Time: 1031 Facilitators: Jennica Tagliaferri-McCall, LRT,CTRS Location: [address] Dayroom   Animal-Assisted Activity (AAA) Program Checklist/Progress Notes Patient Eligibility Criteria Checklist & Daily Group note for Rec Tx Intervention  AAA/T Program Assumption of Risk Form signed by Patient/ or Parent Legal Guardian Yes  Patient understands his/her participation is voluntary Yes  Behavioral Response:    Education: Charity Fundraiser, Appropriate Animal Interaction   Education Outcome: Acknowledges education.    Affect/Mood: N/A   Participation Level: Did not attend    Clinical Observations/Individualized Feedback:      Plan: Continue to engage patient in RT group sessions 2-3x/week.   Kaspian Muccio-McCall, LRT,CTRS 08/10/2024 12:20 PM

## 2024-08-10 NOTE — Group Note (Signed)
 LCSW Group Therapy Note   Group Date: 08/10/2024 Start Time: 1100 End Time: 1200   Participation:  patient was present and actively participated in the discussion.  Type of Therapy:  Group Therapy  Topic:  Shining from Within:  Confidence and Self-Love Journey  Objective:  Promote Self-Awareness and Realistic Self-Talk: Help participants recognize their strengths and replace negative thoughts with truthful, realistic statements to build confidence.  Goals: Increase Confidence: Help participants develop a positive self-image by focusing on their strengths and personal progress. Set Achievable Goals: Guide participants in creating small, realistic goals that foster a sense of accomplishment and build momentum. Enhance Self-Care Practices: Encourage participants to incorporate self-care activities into their routine to support emotional well-being and reinforce confidence.  Summary:  The Shining from Within: Confidence and Self-Love Journey group helped individuals build stronger self-confidence through truthful, realistic self-talk, goal-setting, and self-care practices. Participants recognized their unique strengths, set achievable goals, and nurtured a supportive environment for mutual growth. The group provided practical tools to foster lasting confidence and self-belief, empowering each member to shine from within and embrace their personal power with greater self-assurance.  Therapeutic Modalities:   Cognitive Behavioral Therapy (CBT): Identify and reframe negative self-talk into realistic, positive thoughts Strengths-Based Therapy: Focus on personal strengths, successes, and abilities Psychoeducation: Teach concepts of self-esteem, confidence, and healthy self-talk   Randy MALVA Dynes, LCSW 08/10/2024  12:39 PM

## 2024-08-10 NOTE — Group Note (Signed)
 Date:  08/10/2024 Time:  12:51 PM  Group Topic/Focus:Social work/ Confidence and self worth Self Care:   The focus of this group is to help patients understand the importance of self-care in order to improve or restore emotional, physical, spiritual, interpersonal, and financial health.    Participation Level:  Did Not Attend   Dolores CHRISTELLA Fredericks 08/10/2024, 12:51 PM

## 2024-08-10 NOTE — Progress Notes (Signed)
 [facility] MD Progress Note  08/10/2024 11:14 AM Randy Lane  MRN:  [id number]  Principal Problem: MDD (major depressive disorder), recurrent severe, without psychosis (HCC) Diagnosis: Principal Problem:   MDD (major depressive disorder), recurrent severe, without psychosis (HCC)  Total Time spent with patient:  I personally spent 35 minutes on the unit in direct patient care. The direct patient care time included face-to-face time with the patient, reviewing the patient's chart, communicating with other professionals, and coordinating care.   Identifying Information and Past Psychiatric History:  The patient is a 19 y.o. male (domiciled in group home, employed at [employer]) with a medical history of vitamin D  deficiency and asthma and a psychiatric history most consistent with major depressive disorder and OCD. He is admitted for increased depression and SI in the context of breakup with girlfriend.   Psychiatric history is notable for diagnoses of depression, OCD and autism spectrum disorder on chart review. Previously diagnosed with DMDD and ADHD in childhood/adolescence as well. He has 3 prior hospitalizations, all in childhood/adolescence (most recently 2023 after expressing SI and superficial cut to wrist with scissors at school -age 36) largely for depression and SI as well as emotional lability. He has reported prior suicide attempts (unclear) but none in the last 2-3 years. Previous medications include clonidine , Focalin  XR, Luvox , Prozac  and Zoloft. Currently following with Dr. Akintayo outpatient with home medications: focalin  XR 25 mg BID, Luvox  150 mg BID, clonidone 0.1 mg BID.  Interval events: Patient evaluated at [facility] yesterday. Attending selective groups with fair engagement. Did receive haldol  5 and benadryl  50 early-morning overnight due to loudly yelling in room. Patient not actually agitated - noting history of yelling in sleep. No behavioral concerns reported. BP 118/65 (BP  Location: Left Arm)   Pulse 61   Temp 97.7 F (36.5 C) (Oral)   Resp 16   Ht 5' 6 (1.676 m)   Wt 84.8 kg   SpO2 100%   BMI 30.18 kg/m   Interview today: limied due to somnolence  Today the patient reports he is tired and falls asleep several times during interview. When pushed regarding mood, does state he is better and is not having suicidal thoughts today. He otherwise provides very limited interview, agreeing that he had been suicidal when he came in and that he did go to some groups. Did not voice any additional concerns. Denies AVH.   Past Medical History:  Past Medical History:  Diagnosis Date   ADHD (attention deficit hyperactivity disorder)    Allergy     Angio-edema    Anxiety    Asthma    Attention deficit disorder (ADD), child, with hyperactivity    Autism    Eczema    Food allergy     OCD (obsessive compulsive disorder)    Scoliosis    Urticaria     Past Surgical History:  Procedure Laterality Date   ADENOIDECTOMY     ADENOIDECTOMY     SPINAL FUSION     03/2021 per mother   TYMPANOSTOMY TUBE PLACEMENT     Family History:  Family History  Problem Relation Age of Onset   Myocarditis Brother        14 months   Rheum arthritis Maternal Aunt    Stroke Maternal Grandmother    Diabetes type II Maternal Grandmother    Heart disease Maternal Grandfather    Bowel Disease Maternal Grandfather    Cancer Neg Hx    Heart failure Neg Hx    Hyperlipidemia Neg  Hx    Hypertension Neg Hx    Family Psychiatric  History: denies  Social History:  Social History   Substance and Sexual Activity  Alcohol Use No     Social History   Substance and Sexual Activity  Drug Use No    Social History   Socioeconomic History   Marital status: Single    Spouse name: Not on file   Number of children: Not on file   Years of education: Not on file   Highest education level: Not on file  Occupational History   Occupation: Consulting Civil Engineer  Tobacco Use   Smoking status:  Never   Smokeless tobacco: Never  Vaping Use   Vaping status: Never Used  Substance and Sexual Activity   Alcohol use: No   Drug use: No   Sexual activity: Not Currently  Other Topics Concern   Not on file  Social History Narrative   Alegandro is an 11th grade student.   He attends [employer].   He lives with his mom only.   He enjoys engineer, site movies (his favorite character is Academic Librarian), video games and youtube, and hanging out with his friends.    Social Drivers of Health   Tobacco Use: Low Risk (08/08/2024)   Patient History    Smoking Tobacco Use: Never    Smokeless Tobacco Use: Never    Passive Exposure: Not on file  Financial Resource Strain: Not on file  Food Insecurity: No Food Insecurity (08/08/2024)   Epic    Worried About Programme Researcher, Broadcasting/film/video in the Last Year: Never true    Ran Out of Food in the Last Year: Never true  Transportation Needs: No Transportation Needs (08/08/2024)   Epic    Lack of Transportation (Medical): No    Lack of Transportation (Non-Medical): No  Physical Activity: Not on file  Stress: Not on file  Social Connections: Unknown (08/08/2024)   Social Connection and Isolation Panel    Frequency of Communication with Friends and Family: Never    Frequency of Social Gatherings with Friends and Family: Not on file    Attends Religious Services: Never    Active Member of Clubs or Organizations: No    Attends Banker Meetings: Never    Marital Status: Never married  Depression (PHQ2-9): Low Risk (11/12/2023)   Depression (PHQ2-9)    PHQ-2 Score: 0  Alcohol Screen: Low Risk (08/08/2024)   Alcohol Screen    Last Alcohol Screening Score (AUDIT): 0  Housing: Low Risk (08/08/2024)   Epic    Unable to Pay for Housing in the Last Year: No    Number of Times Moved in the Last Year: 0    Homeless in the Last Year: No  Utilities: Not At Risk (08/08/2024)   Epic    Threatened with loss of utilities: No  Health Literacy: Not on  file     Current Medications: Current Facility-Administered Medications  Medication Dose Route Frequency Provider Last Rate Last Admin   acetaminophen  (TYLENOL ) tablet 650 mg  650 mg Oral Q6H PRN Hobson, Fran E, NP       albuterol  (VENTOLIN  HFA) 108 (90 Base) MCG/ACT inhaler 2 puff  2 puff Inhalation Q4H PRN Ntuen, Tina C, FNP       alum & mag hydroxide-simeth (MAALOX/MYLANTA) 200-200-20 MG/5ML suspension 30 mL  30 mL Oral Q4H PRN Hobson, Fran E, NP       cloNIDine  (CATAPRES ) tablet 0.1 mg  0.1 mg  Oral BID Hobson, Fran E, NP   0.1 mg at 08/10/24 1033   dexmethylphenidate  (FOCALIN  XR) 24 hr capsule 25 mg  25 mg Oral Daily Hobson, Fran E, NP   25 mg at 08/10/24 1033   haloperidol  (HALDOL ) tablet 5 mg  5 mg Oral TID PRN Hobson, Fran E, NP   5 mg at 08/10/24 0255   And   diphenhydrAMINE  (BENADRYL ) capsule 50 mg  50 mg Oral TID PRN Hobson, Fran E, NP   50 mg at 08/10/24 0255   haloperidol  lactate (HALDOL ) injection 5 mg  5 mg Intramuscular TID PRN Hobson, Fran E, NP       And   diphenhydrAMINE  (BENADRYL ) injection 50 mg  50 mg Intramuscular TID PRN Hobson, Fran E, NP       And   LORazepam  (ATIVAN ) injection 2 mg  2 mg Intramuscular TID PRN Hobson, Fran E, NP       haloperidol  lactate (HALDOL ) injection 10 mg  10 mg Intramuscular TID PRN Hobson, Fran E, NP       And   diphenhydrAMINE  (BENADRYL ) injection 50 mg  50 mg Intramuscular TID PRN Hobson, Fran E, NP       And   LORazepam  (ATIVAN ) injection 2 mg  2 mg Intramuscular TID PRN Hobson, Fran E, NP       FLUoxetine  (PROZAC ) capsule 10 mg  10 mg Oral Daily Ntuen, Tina C, FNP   10 mg at 08/10/24 1033   fluticasone  (FLOVENT  HFA) 110 MCG/ACT inhaler 2 puff  2 puff Inhalation BID Ntuen, Tina C, FNP   2 puff at 08/10/24 1033   fluvoxaMINE  (LUVOX ) tablet 150 mg  150 mg Oral BID Hobson, Fran E, NP   150 mg at 08/10/24 1033   loratadine  (CLARITIN ) tablet 10 mg  10 mg Oral QPM Ntuen, Tina C, FNP   10 mg at 08/09/24 1814   magnesium  hydroxide (MILK OF  MAGNESIA) suspension 30 mL  30 mL Oral Daily PRN Hobson, Fran E, NP       Vitamin D  (Ergocalciferol ) (DRISDOL ) 1.25 MG (50000 UNIT) capsule 50,000 Units  50,000 Units Oral Q7 days Ntuen, Tina C, FNP   50,000 Units at 08/09/24 1556    Lab Results: No results found for this or any previous visit (from the past 48 hours).  Blood Alcohol level:  Lab Results  Component Value Date   [facility] <15 08/07/2024   ETH <10 11/08/2023    Metabolic Disorder Labs: Lab Results  Component Value Date   HGBA1C 4.5 (L) 11/08/2023   MPG 82.45 11/08/2023   MPG 91.06 09/24/2021   Lab Results  Component Value Date   PROLACTIN 25.9 11/08/2023   PROLACTIN 13.5 09/24/2021   Lab Results  Component Value Date   CHOL 161 11/08/2023   TRIG 99 11/08/2023   HDL 49 11/08/2023   CHOLHDL 3.3 11/08/2023   VLDL 20 11/08/2023   LDLCALC 92 11/08/2023   LDLCALC 58 09/24/2021    Physical Findings: AIMS:  ,  ,  ,  ,  ,  ,   CIWA:    COWS:     Mental Status exam: Appearance: black male of elevated BMI, seen under covers in bed, appropriately groomed  Eye contact: limited - keeps eyes closed  Attitude towards examiner disengaged - appears largely volitional, in part due to fatigue  Psychomotor: no agitation or retardation  Speech: reduced amount, quiet and mumbled  Language: no delays  Mood: tired and when pushed I  guess better  Affect: somnolent, congruent  Thought content: denying SI and HI today, no delusions expressed  Thought Process: linear and organized  Perception: denying AVH, not RTIS  Insight: fair  Judgement: fair   Orientation: x3 Attention/Concentration: limited due to somnolence Memory/Cognition: grossly intact on limited conversation   Fund of Knowledge: Average    Musculoskeletal: Strength & Muscle Tone: within normal limits Gait & Station: normal Patient leans: N/A    Physical Exam: Physical Exam Constitutional:      Appearance: Normal appearance.  HENT:     Head:  Normocephalic and atraumatic.  Pulmonary:     Effort: Pulmonary effort is normal.  Abdominal:     General: There is no distension.  Neurological:     General: No focal deficit present.     Mental Status: He is alert.    Review of Systems  All other systems reviewed and are negative.  Blood pressure 118/65, pulse 61, temperature 97.7 F (36.5 C), temperature source Oral, resp. rate 16, height 5' 6 (1.676 m), weight 84.8 kg, SpO2 100%. Body mass index is 30.18 kg/m.   Treatment Plan Summary: Daily contact with patient to assess and evaluate symptoms and progress in treatment   Assessment: The patient is a 19 y.o. male with a psychiatric history most consistent with MDD, OCD and ADHD who is admitted for increased depression and SI in the context of breakup. He has been adherent to his home medications which were continued for him unchanged, and on intake yesterday was additionally offered prozac  10 mg daily  Today the patient was extremely somnolent. Appeared volitoinally disengaged at times but did receive PRN haldol  for yelling loudly overnight (not related to psychosis, per patient does yell in his sleep). He did deny SI but additional improvements were difficult to gauge due to somnolence. Will not make further medicine changes today and will continue to monitor   DSM-5 diagnoses: Major Depressive Disorder, recurrent, severe, without psychotic features  OCD (by history) ADHD (by history)   Plan:  Legal Status: -Voluntary  Safety -q15 minute checks  -elopement, suicide and assault precautions  -daily vitals  Psychiatric Concerns  -Continue Prozac  10 mg p.o. daily for depression and anxiety.   --Continue clonidine  0.1 mg p.o. twice daily for ADHD --Continue Focalin  XR 25 mg p.o. daily for ADHD --Continue Luvox  150 mg p.o. twice daily for OCD  --Continue trazodone  tablets 50 mg p.o. q. nightly as needed for insomnia -- Continue hydroxyzine  tablet 25 mg p.o. 3 times  daily as needed for anxiety  -Trazodone  50 mg at bedtime PRN for sleep -Hydroxyzine  25 mg TID PRN for anxiety -haldol  5mg  + ativan  2 mg + benadryl  50 mg PRN for agitation  Substance use concerns  -None   Nicotine Replacement  N/A; does not use nicotine products   Medical concerns Vitamin D  deficiency -continue high-dose 50 000 U weekly on Mondays  Additional PRNs: -Tylenol  tablets 650 mg every 6 hours as needed for pain -Maalox/Mylanta suspension 30 mL every 4 hours as needed for indigestion  -Milk of Magnesia 30 mL daily as needed for constipation  Labs 08/07/24 -Vitamin D  12.7 (high dose started) -CBC and CMP grossly WNL, TSH WNL -UDS and BAL negative   EKG NSR with Qtc 403  Psychosocial interventions   -daily medication management with psychiatry -Medication education regarding risks/benefits and alternatives -bedside psychotherapy as indicated  -Patient will be encouraged to participate and engage with group therapy  -Appreciate SW assistance in coordinating safe disposition  -  Anticipated LOS 5 days    Leita LOISE Arts, MD 08/10/2024, 11:14 AM

## 2024-08-10 NOTE — Progress Notes (Addendum)
 Pt presents very tired and groggy this morning, observed standing at med window with eyes closed. Pt required a lot motivation from staff to wake up and get medication, pt finally woke up at 10:30am and took morning medications. Pt calm and pleasant and verbalizes no concerns at this time.    08/10/24 0938  Psych Admission Type (Psych Patients Only)  Admission Status Voluntary  Psychosocial Assessment  Patient Complaints Sleep disturbance  Eye Contact Fair  Facial Expression Flat  Affect Anxious;Depressed  Speech Logical/coherent  Interaction Cautious  Motor Activity Fidgety  Appearance/Hygiene Unremarkable  Behavior Characteristics Cooperative  Mood Anxious  Thought Process  Coherency WDL  Content WDL  Delusions None reported or observed  Perception WDL  Hallucination None reported or observed  Judgment Impaired  Confusion None  Danger to Self  Current suicidal ideation? Denies  Description of Suicide Plan no plan  Self-Injurious Behavior No self-injurious ideation or behavior indicators observed or expressed   Agreement Not to Harm Self Yes  Description of Agreement verbal  Danger to Others  Danger to Others None reported or observed

## 2024-08-10 NOTE — Plan of Care (Signed)
   Problem: Education: Goal: Knowledge of [facility] [facility] Education information/materials will improve Outcome: Progressing Goal: Emotional status will improve Outcome: Progressing Goal: Mental status will improve Outcome: Progressing

## 2024-08-10 NOTE — Group Note (Signed)
 Date:  08/10/2024 Time:  9:41 AM  Group Topic/Focus: goals and orientation Goals Group:   The focus of this group is to help patients establish daily goals to achieve during treatment and discuss how the patient can incorporate goal setting into their daily lives to aide in recovery. Orientation:   The focus of this group is to educate the patient on the purpose and policies of crisis stabilization and provide a format to answer questions about their admission.  The group details unit policies and expectations of patients while admitted.    Participation Level:  None  Participation Quality:  Inattentive  Affect:  Flat  Cognitive:  Lacking  Insight: Lacking  Engagement in Group:  Poor  Modes of Intervention:  Orientation  Additional Comments:  Patient was sleeping during group  [employer] 08/10/2024, 9:41 AM

## 2024-08-10 NOTE — Group Note (Signed)
 Date:  08/10/2024 Time:  4:17 PM  Group Topic/Focus:[employer]  SmartGoals Group:   The focus of this group is to help patients establish daily goals to achieve during treatment and discuss how the patient can incorporate goal setting into their daily lives to aide in recovery.    Participation Level:  Active   Randy Lane 08/10/2024, 4:17 PM

## 2024-08-10 NOTE — Plan of Care (Signed)
   Problem: Education: Goal: Knowledge of [place] [facility] Education information/materials will improve Outcome: Progressing Goal: Emotional status will improve Outcome: Progressing Goal: Mental status will improve Outcome: Progressing Goal: Verbalization of understanding the information provided will improve Outcome: Progressing

## 2024-08-10 NOTE — Group Note (Signed)
 Date:  08/10/2024 Time:  3:47 PM  Group Topic/Focus: Write a note to your past or present self Emotional Education:   The focus of this group is to discuss what feelings/emotions are, and how they are experienced.    Participation Level:  Active  Participation Quality:  Appropriate  Affect:  Appropriate  Cognitive:  Appropriate  Insight: Appropriate  Engagement in Group:  Engaged  Modes of Intervention:  Discussion  Additional Comments:  Pt engaged appropriately during group.  Adante Courington D Segundo Makela 08/10/2024, 3:47 PM

## 2024-08-11 NOTE — [facility] Group Notes (Signed)
 [facility] Group Notes:  (Nursing/MHT/Case Management/Adjunct)  Date:  08/11/2024  Time:  12:02 AM  Type of Therapy:  Wrap up group  Participation Level:  Active  Participation Quality:  Appropriate  Affect:  Appropriate  Cognitive:  Appropriate  Insight:  Appropriate  Engagement in Group:  Engaged  Modes of Intervention:  Education  Summary of Progress/Problems: goal Learn more resources. Rated day 9/10.  Randy Lane [place] 08/11/2024, 12:02 AM

## 2024-08-11 NOTE — Group Note (Signed)
 Date:  08/11/2024 Time:  6:35 PM  Group Topic/Focus:  Goals Group:   The focus of this group is to help patients establish daily goals to achieve during treatment and discuss how the patient can incorporate goal setting into their daily lives to aide in recovery. Orientation:   The focus of this group is to educate the patient on the purpose and policies of crisis stabilization and provide a format to answer questions about their admission.  The group details unit policies and expectations of patients while admitted.    Participation Level:  Did Not Attend   Randy Lane [place] 08/11/2024, 6:35 PM

## 2024-08-11 NOTE — Progress Notes (Signed)
 Spirituality group facilitated by Elia Rockie Sofia, BCC.  Group Description: Group focused on topic of hope. Patients participated in facilitated discussion around topic, connecting with one another around experiences and definitions for hope. Group members engaged with visual explorer photos, reflecting on what hope looks like for them today. Group engaged in discussion around how their definitions of hope are present today in hospital.  Modalities: Psycho-social ed, Adlerian, Narrative, MI  Patient Progress: Edem attended group and actively engaged and participated in group conversation and activities.  Comments demonstrated good insight and contributed positively to the group conversation.

## 2024-08-11 NOTE — Progress Notes (Signed)
(  Sleep Hours) -7.5 as of 0530 (Any PRNs that were needed, meds refused, or side effects to meds)- none (Any disturbances and when (visitation, over night)-none (Concerns raised by the patient)- none (SI/HI/AVH)- denies all

## 2024-08-11 NOTE — Plan of Care (Signed)
 Pt was out in the milieu during the day acting appropriately. Went to [employer] and ate adequately. Attending group activity and participated. Denies SI/HI/SH/paranoia/AVH. Will continue to monitor.

## 2024-08-11 NOTE — [facility] Group Notes (Signed)
 [facility] Group Notes:  (Nursing/MHT/Case Management/Adjunct)  Date:  08/11/2024  Time:  8:16 PM  Type of Therapy:  NA Group  Participation Level:  Active  Participation Quality:  Appropriate  Affect:  Appropriate  Cognitive:  Appropriate  Insight:  Appropriate  Engagement in Group:  Engaged  Modes of Intervention:  Education  Summary of Progress/Problems: Attended NA meeting.  Randy Lane 08/11/2024, 8:16 PM

## 2024-08-11 NOTE — Group Note (Signed)
 Date:  08/11/2024 Time:  1:24 PM  Group Topic/Focus:   Pharmacy Group   Pt did attend pharmacy group   Karmel Patricelli D Maylen Waltermire 08/11/2024, 1:24 PM

## 2024-08-11 NOTE — Progress Notes (Signed)
 [facility] MD Progress Note  08/11/2024 8:00 AM Randy Lane  MRN:  [id number]  Principal Problem: MDD (major depressive disorder), recurrent severe, without psychosis (HCC) Diagnosis: Principal Problem:   MDD (major depressive disorder), recurrent severe, without psychosis (HCC)  Total Time spent with patient:  I personally spent 35 minutes on the unit in direct patient care. The direct patient care time included face-to-face time with the patient, reviewing the patient's chart, communicating with other professionals, and coordinating care.   Identifying Information and Past Psychiatric History:  The patient is a 19 y.o. male (domiciled in group home, employed at [employer]) with a medical history of vitamin D  deficiency and asthma and a psychiatric history most consistent with major depressive disorder and OCD. He is admitted for increased depression and SI in the context of breakup with girlfriend.   Psychiatric history is notable for diagnoses of depression, OCD and autism spectrum disorder on chart review. Previously diagnosed with DMDD and ADHD in childhood/adolescence as well. He has 3 prior hospitalizations, all in childhood/adolescence (most recently 2023 after expressing SI and superficial cut to wrist with scissors at school -age 20) largely for depression and SI as well as emotional lability. He has reported prior suicide attempts (unclear) but none in the last 2-3 years. Previous medications include clonidine , Focalin  XR, Luvox , Prozac  and Zoloft. Currently following with Dr. Akintayo outpatient with home medications: focalin  XR 25 mg BID, Luvox  150 mg BID, clonidone 0.1 mg BID.  Interval events: Patient somnolent yesterday morning/afternoon due to late morning agitation protocol utilized for nighttime yelling and missed morning groups. In later afternoon he was up and engaged appropriately in groups. Medication compliant. Denying SI, hI and AVH. Slept 7.25 hrs overnight.   BP 129/77    Pulse 67   Temp 97.7 F (36.5 C) (Oral)   Resp 16   Ht 5' 6 (1.676 m)   Wt 84.8 kg   SpO2 100%   BMI 30.18 kg/m   Interview today: Today the patient states he is okay. Nods when asked if he was having a hard time before coming into the hospital. States that he was going through breakup and also trying to work to get enough money to afford his own place and was very overwhelmed. Patient states he is currently in a group home and has been there for about 4 months. He overall does not have problems living there, although would prefer to be able to support himself. He has found the groups here helpful and believes that when he leaves the hospital he would have an easier time with more supports. Requests we give him information about group therapy outside the hospital. After discussion, he was open to IOP or PHP as an option. Denying SI, HI and AVH today. No side effects to prozac , we discussed risks/benefits/alternatives and he would prefer to stay on this low dose for now    Past Medical History:  Past Medical History:  Diagnosis Date   ADHD (attention deficit hyperactivity disorder)    Allergy     Angio-edema    Anxiety    Asthma    Attention deficit disorder (ADD), child, with hyperactivity    Autism    Eczema    Food allergy     OCD (obsessive compulsive disorder)    Scoliosis    Urticaria     Past Surgical History:  Procedure Laterality Date   ADENOIDECTOMY     ADENOIDECTOMY     SPINAL FUSION     03/2021 per mother  TYMPANOSTOMY TUBE PLACEMENT     Family History:  Family History  Problem Relation Age of Onset   Myocarditis Brother        14 months   Rheum arthritis Maternal Aunt    Stroke Maternal Grandmother    Diabetes type II Maternal Grandmother    Heart disease Maternal Grandfather    Bowel Disease Maternal Grandfather    Cancer Neg Hx    Heart failure Neg Hx    Hyperlipidemia Neg Hx    Hypertension Neg Hx    Family Psychiatric  History: denies  Social  History:  Social History   Substance and Sexual Activity  Alcohol Use No     Social History   Substance and Sexual Activity  Drug Use No    Social History   Socioeconomic History   Marital status: Single    Spouse name: Not on file   Number of children: Not on file   Years of education: Not on file   Highest education level: Not on file  Occupational History   Occupation: Consulting Civil Engineer  Tobacco Use   Smoking status: Never   Smokeless tobacco: Never  Vaping Use   Vaping status: Never Used  Substance and Sexual Activity   Alcohol use: No   Drug use: No   Sexual activity: Not Currently  Other Topics Concern   Not on file  Social History Narrative   Ike is an 11th grade student.   He attends [employer].   He lives with his mom only.   He enjoys engineer, site movies (his favorite character is Academic Librarian), video games and youtube, and hanging out with his friends.    Social Drivers of Health   Tobacco Use: Low Risk (08/08/2024)   Patient History    Smoking Tobacco Use: Never    Smokeless Tobacco Use: Never    Passive Exposure: Not on file  Financial Resource Strain: Not on file  Food Insecurity: No Food Insecurity (08/08/2024)   Epic    Worried About Programme Researcher, Broadcasting/film/video in the Last Year: Never true    Ran Out of Food in the Last Year: Never true  Transportation Needs: No Transportation Needs (08/08/2024)   Epic    Lack of Transportation (Medical): No    Lack of Transportation (Non-Medical): No  Physical Activity: Not on file  Stress: Not on file  Social Connections: Unknown (08/08/2024)   Social Connection and Isolation Panel    Frequency of Communication with Friends and Family: Never    Frequency of Social Gatherings with Friends and Family: Not on file    Attends Religious Services: Never    Active Member of Clubs or Organizations: No    Attends Banker Meetings: Never    Marital Status: Never married  Depression (PHQ2-9): Low Risk  (11/12/2023)   Depression (PHQ2-9)    PHQ-2 Score: 0  Alcohol Screen: Low Risk (08/08/2024)   Alcohol Screen    Last Alcohol Screening Score (AUDIT): 0  Housing: Low Risk (08/08/2024)   Epic    Unable to Pay for Housing in the Last Year: No    Number of Times Moved in the Last Year: 0    Homeless in the Last Year: No  Utilities: Not At Risk (08/08/2024)   Epic    Threatened with loss of utilities: No  Health Literacy: Not on file     Current Medications: Current Facility-Administered Medications  Medication Dose Route Frequency Provider Last Rate Last Admin  acetaminophen  (TYLENOL ) tablet 650 mg  650 mg Oral Q6H PRN Hobson, Fran E, NP       albuterol  (VENTOLIN  HFA) 108 (90 Base) MCG/ACT inhaler 2 puff  2 puff Inhalation Q4H PRN Ntuen, Tina C, FNP       alum & mag hydroxide-simeth (MAALOX/MYLANTA) 200-200-20 MG/5ML suspension 30 mL  30 mL Oral Q4H PRN Hobson, Fran E, NP       cloNIDine  (CATAPRES ) tablet 0.1 mg  0.1 mg Oral BID Hobson, Fran E, NP   0.1 mg at 08/11/24 0747   dexmethylphenidate  (FOCALIN  XR) 24 hr capsule 25 mg  25 mg Oral Daily Hobson, Fran E, NP   25 mg at 08/11/24 0746   haloperidol  (HALDOL ) tablet 5 mg  5 mg Oral TID PRN Hobson, Fran E, NP   5 mg at 08/10/24 0255   And   diphenhydrAMINE  (BENADRYL ) capsule 50 mg  50 mg Oral TID PRN Hobson, Fran E, NP   50 mg at 08/10/24 0255   haloperidol  lactate (HALDOL ) injection 5 mg  5 mg Intramuscular TID PRN Hobson, Fran E, NP       And   diphenhydrAMINE  (BENADRYL ) injection 50 mg  50 mg Intramuscular TID PRN Hobson, Fran E, NP       And   LORazepam  (ATIVAN ) injection 2 mg  2 mg Intramuscular TID PRN Hobson, Fran E, NP       haloperidol  lactate (HALDOL ) injection 10 mg  10 mg Intramuscular TID PRN Hobson, Fran E, NP       And   diphenhydrAMINE  (BENADRYL ) injection 50 mg  50 mg Intramuscular TID PRN Hobson, Fran E, NP       And   LORazepam  (ATIVAN ) injection 2 mg  2 mg Intramuscular TID PRN Hobson, Fran E, NP       FLUoxetine   (PROZAC ) capsule 10 mg  10 mg Oral Daily Ntuen, Tina C, FNP   10 mg at 08/11/24 0747   fluticasone  (FLOVENT  HFA) 110 MCG/ACT inhaler 2 puff  2 puff Inhalation BID Ntuen, Tina C, FNP   2 puff at 08/11/24 0746   fluvoxaMINE  (LUVOX ) tablet 150 mg  150 mg Oral BID Hobson, Fran E, NP   150 mg at 08/11/24 0747   loratadine  (CLARITIN ) tablet 10 mg  10 mg Oral QPM Ntuen, Tina C, FNP   10 mg at 08/10/24 1812   magnesium  hydroxide (MILK OF MAGNESIA) suspension 30 mL  30 mL Oral Daily PRN Hobson, Fran E, NP       Vitamin D  (Ergocalciferol ) (DRISDOL ) 1.25 MG (50000 UNIT) capsule 50,000 Units  50,000 Units Oral Q7 days Ntuen, Tina C, FNP   50,000 Units at 08/09/24 1556    Lab Results: No results found for this or any previous visit (from the past 48 hours).  Blood Alcohol level:  Lab Results  Component Value Date   Racine <15 08/07/2024   ETH <10 11/08/2023    Metabolic Disorder Labs: Lab Results  Component Value Date   HGBA1C 4.5 (L) 11/08/2023   MPG 82.45 11/08/2023   MPG 91.06 09/24/2021   Lab Results  Component Value Date   PROLACTIN 25.9 11/08/2023   PROLACTIN 13.5 09/24/2021   Lab Results  Component Value Date   CHOL 161 11/08/2023   TRIG 99 11/08/2023   HDL 49 11/08/2023   CHOLHDL 3.3 11/08/2023   VLDL 20 11/08/2023   LDLCALC 92 11/08/2023   LDLCALC 58 09/24/2021    Physical Findings: AIMS:  ,  ,  ,  ,  ,  ,  CIWA:    COWS:     Mental Status exam: Appearance: black male of elevated BMI, seen with peers in day room Eye contact: fair, often looks away Attitude towards examiner somewhat withdrawn but cooperative; answers questions  Psychomotor: no agitation or retardation; does bounce in place repetitively  Speech: reduced amount, quiet, but more normal in prosody and speaks in full sentences today Language: no delays  Mood: okay  Affect: restricted, anxious Thought content: denying SI and HI today, no delusions expressed  Thought Process: linear and organized   Perception: denying AVH, not RTIS  Insight: fair  Judgement: fair   Orientation: x3 Attention/Concentration: good - attends to interview Memory/Cognition: grossly intact on conversation   Fund of Knowledge: Average    Musculoskeletal: Strength & Muscle Tone: within normal limits Gait & Station: normal Patient leans: N/A    Physical Exam: Physical Exam Constitutional:      Appearance: Normal appearance.  HENT:     Head: Normocephalic and atraumatic.  Pulmonary:     Effort: Pulmonary effort is normal.  Abdominal:     General: There is no distension.  Neurological:     General: No focal deficit present.     Mental Status: He is alert.    Review of Systems  All other systems reviewed and are negative.  Blood pressure 129/77, pulse 67, temperature 97.7 F (36.5 C), temperature source Oral, resp. rate 16, height 5' 6 (1.676 m), weight 84.8 kg, SpO2 100%. Body mass index is 30.18 kg/m.   Treatment Plan Summary: Daily contact with patient to assess and evaluate symptoms and progress in treatment   Assessment: The patient is a 19 y.o. male with a psychiatric history most consistent with MDD, OCD and ADHD who is admitted for increased depression and SI in the context of breakup. He has been adherent to his home medications which were continued for him unchanged, and on intake was additionally offered prozac  10 mg daily  Yesterday the patient was extremely somnolent and provided limited interview, however in the afternoon was alert and engaged in groups, denying concerns. Today the patient was seen to be more alert. Was restricted in affect, anxious appearing and withdrawn. Some visible depressive signs objectively, but was denying SI today. Voiced preference for continuing some sort of group therapy after the hospital and so will look into IOP or PHP for him. Patient notified that prozac  was similar in action to luvox , discussed risks/benefits/alternatives and he preferred to  stay on for now.    DSM-5 diagnoses: Major Depressive Disorder, recurrent, severe, without psychotic features  OCD (by history) ADHD (by history)   Plan:  Legal Status: -Voluntary  Safety -q15 minute checks  -elopement, suicide and assault precautions  -daily vitals  Psychiatric Concerns  -Continue Prozac  10 mg p.o. daily for depression and anxiety.   --Continue clonidine  0.1 mg p.o. twice daily for ADHD --Continue Focalin  XR 25 mg p.o. daily for ADHD --Continue Luvox  150 mg p.o. twice daily for OCD  --Continue trazodone  tablets 50 mg p.o. q. nightly as needed for insomnia -- Continue hydroxyzine  tablet 25 mg p.o. 3 times daily as needed for anxiety  -Trazodone  50 mg at bedtime PRN for sleep -Hydroxyzine  25 mg TID PRN for anxiety -haldol  5mg  + ativan  2 mg + benadryl  50 mg PRN for agitation  Substance use concerns  -None   Nicotine Replacement  N/A; does not use nicotine products   Medical concerns Vitamin D  deficiency -continue high-dose 50 000 U weekly on Mondays  Additional PRNs: -Tylenol  tablets 650 mg every 6 hours as needed for pain -Maalox/Mylanta suspension 30 mL every 4 hours as needed for indigestion  -Milk of Magnesia 30 mL daily as needed for constipation  Labs 08/07/24 -Vitamin D  12.7 (high dose started) -CBC and CMP grossly WNL, TSH WNL -UDS and BAL negative   EKG NSR with Qtc 403  Psychosocial interventions   -daily medication management with psychiatry -Medication education regarding risks/benefits and alternatives -bedside psychotherapy as indicated  -Patient will be encouraged to participate and engage with group therapy  -Appreciate SW assistance in coordinating safe disposition  -Anticipated LOS 3-5 days    Leita LOISE Arts, MD 08/11/2024, 8:00 AM

## 2024-08-11 NOTE — Plan of Care (Signed)
   Problem: Education: Goal: Knowledge of [facility] [facility] Education information/materials will improve Outcome: Progressing Goal: Emotional status will improve Outcome: Progressing Goal: Mental status will improve Outcome: Progressing

## 2024-08-11 NOTE — Group Note (Signed)
 Date:  08/11/2024 Time:  3:39 PM  Group Topic/Focus:   Overcoming Stress:   The focus of this group is to introduce and practice Progressive Muscle Relaxation, a technique that helps reduce physical tension and alleviate stress and anger.    Participation Level:  Active  Participation Quality:  Appropriate  Affect:  Appropriate  Cognitive:  Appropriate  Insight: Appropriate  Engagement in Group:  Engaged  Modes of Intervention:  Activity  Additional Comments:    Velina Drollinger D Birch Farino 08/11/2024, 3:39 PM

## 2024-08-11 NOTE — Group Note (Signed)
 Date:  08/11/2024 Time:  1:40 PM  Group Topic/Focus:   Spiritual Wellness (Chaplain)   Pt did attend spiritual wellness group    Rollie Hynek D Skarleth Delmonico 08/11/2024, 1:40 PM

## 2024-08-11 NOTE — Group Note (Signed)
 Date:  08/11/2024 Time:  5:40 PM  Group Topic/Focus:  Relapse Prevention Planning:   The focus of this group is to define relapse and discuss the need for planning to combat relapse. Natural ways to get dopamine hits as opposed to illicit drugs which cause psychosis.    Participation Level:  Active  Participation Quality:  Appropriate and Attentive  Affect:  Appropriate  Cognitive:  Appropriate  Insight: Appropriate  Engagement in Group:  Engaged  Modes of Intervention:  Discussion and Education  Additional Comments:    Randy Lane Huddle 08/11/2024, 5:40 PM

## 2024-08-12 NOTE — Progress Notes (Signed)
(  Sleep Hours) -7.5 as of 0530 (Any PRNs that were needed, meds refused, or side effects to meds)- none (Any disturbances and when (visitation, over night)-none (Concerns raised by the patient)- none (SI/HI/AVH)- denies all

## 2024-08-12 NOTE — Group Note (Signed)
 Date:  08/12/2024 Time:  8:54 AM  Group Topic/Focus:  Goals Group:   The focus of this group is to help patients establish daily goals to achieve during treatment and discuss how the patient can incorporate goal setting into their daily lives to aide in recovery. Orientation:   The focus of this group is to educate the patient on the purpose and policies of crisis stabilization and provide a format to answer questions about their admission.  The group details unit policies and expectations of patients while admitted.    Participation Level:  Active  Participation Quality:  Appropriate  Affect:  Appropriate  Cognitive:  Appropriate  Insight: Appropriate  Engagement in Group:  Engaged  Modes of Intervention:  Activity  Additional Comments:  pt attended and participated;   Vena Mais 08/12/2024, 8:54 AM

## 2024-08-12 NOTE — [facility] Group Notes (Signed)
 [facility] Group Notes:  (Nursing/MHT/Case Management/Adjunct)  Date:  08/12/2024  Time:  10:40 PM  Type of Therapy:  Wrap up group  Participation Level:  Active  Participation Quality:  Appropriate  Affect:  Appropriate  Cognitive:  Appropriate  Insight:  Appropriate  Engagement in Group:  Engaged  Modes of Intervention:  Education  Summary of Progress/Problems: Goal D/C planning. Rated day 10/10.  Grayce LITTIE Essex 08/12/2024, 10:40 PM

## 2024-08-12 NOTE — Plan of Care (Signed)
 Pt was out in the milieu during the day acting appropriately. Went to [employer] and ate adequately. Attending group activity and participated. Denies SI/HI/SH/paranoia/AVH. Will continue to monitor.

## 2024-08-12 NOTE — Group Note (Signed)
 Date:  08/12/2024 Time:  2:19 PM  Group Topic/Focus:  Patients participated in a psychoeducational and process-oriented group focused on grief. Group members were encouraged to identify personal experiences of loss, discuss emotional responses related to grief, and explore how grief has impacted their daily functioning. Patients practiced expressing thoughts and feelings in a supportive group setting and identified healthy coping strategies and support systems related to the grieving process. Participation included listening to peers, sharing as able, and engaging in guided discussion.    Participation Level:  Active   Randy Lane 08/12/2024, 2:19 PM

## 2024-08-12 NOTE — Group Note (Signed)
 Recreation Therapy Group Note   Group Topic:Other  Group Date: 08/12/2024 Start Time: 1305 End Time: 1345 Facilitators: Keta Vanvalkenburgh-McCall, LRT,CTRS Location: [address] Dayroom   Activity Description/Intervention: Therapeutic Drumming. Patients with peers and staff were given the opportunity to engage in a leader facilitated [facility] Group Empowerment Drumming Circle with staff from the [employer], in partnership with [employer]. Teaching laboratory technician and trained [place]alt [place], Norleen Mon leading with LRT observing and documenting intervention and pt response. This evidenced-based practice targets 7 areas of health and wellbeing in the human experience including: stress-reduction, exercise, self-expression, camaraderie/support, nurturing, spirituality, and music-making (leisure).   Goal Area(s) Addresses:  Patient will engage in pro-social way in music group.  Patient will follow directions of drum leader on the first prompt. Patient will demonstrate no behavioral issues during group.  Patient will identify if a reduction in stress level occurs as a result of participation in therapeutic drum circle.    Education: Leisure exposure, Pharmacologist, Musical expression, Discharge Planning   Affect/Mood: Appropriate   Participation Level: Engaged   Participation Quality: Independent   Behavior: Appropriate   Speech/Thought Process: Focused   Insight: Good   Judgement: Good   Modes of Intervention: Teaching Laboratory Technician   Patient Response to Interventions:  Engaged   Education Outcome:  In group clarification offered    Clinical Observations/Individualized Feedback: Marshel actively engaged in therapeutic drumming exercise and discussions. Pt was appropriate with peers, staff, and musical equipment for duration of programming.  Pt identified silly as their feeling after participation in music-based programming. Pt affect congruent with verbalized  emotion.      Plan: Continue to engage patient in RT group sessions 2-3x/week.   Daune Colgate-McCall, LRT,CTRS  08/12/2024 3:13 PM

## 2024-08-12 NOTE — Plan of Care (Signed)
   Problem: Education: Goal: Knowledge of [facility] [facility] Education information/materials will improve Outcome: Progressing Goal: Emotional status will improve Outcome: Progressing Goal: Mental status will improve Outcome: Progressing

## 2024-08-12 NOTE — Progress Notes (Signed)
 [facility] MD Progress Note  08/12/2024 7:50 AM KEVAN PROUTY  MRN:  [id number]  Principal Problem: MDD (major depressive disorder), recurrent severe, without psychosis (HCC) Diagnosis: Principal Problem:   MDD (major depressive disorder), recurrent severe, without psychosis (HCC)  Total Time spent with patient:  I personally spent 35 minutes on the unit in direct patient care. The direct patient care time included face-to-face time with the patient, reviewing the patient's chart, communicating with other professionals, and coordinating care.   Identifying Information and Past Psychiatric History:  The patient is a 19 y.o. male (domiciled in group home, employed at [employer]) with a medical history of vitamin D  deficiency and asthma and a psychiatric history most consistent with major depressive disorder and OCD. He is admitted for increased depression and SI in the context of breakup with girlfriend.   Psychiatric history is notable for diagnoses of depression, OCD and autism spectrum disorder on chart review. Previously diagnosed with DMDD and ADHD in childhood/adolescence as well. He has 3 prior hospitalizations, all in childhood/adolescence (most recently 2023 after expressing SI and superficial cut to wrist with scissors at school -age 66) largely for depression and SI as well as emotional lability. He has reported prior suicide attempts (unclear) but none in the last 2-3 years. Previous medications include clonidine , Focalin  XR, Luvox , Prozac  and Zoloft. Currently following with Dr. Akintayo outpatient with home medications: focalin  XR 25 mg BID, Luvox  150 mg BID, clonidone 0.1 mg BID.  Interval events: Patient has been attending all groups with good and appropriate engagement.  Noted to be interacting appropriately with staff and peers. Denying SI, HI and AVH, no behavioral concerns. No agitation protocol required.   BP 127/71 (BP Location: Left Arm)   Pulse 67   Temp 98.2 F (36.8 C) (Oral)    Resp 16   Ht 5' 6 (1.676 m)   Wt 84.8 kg   SpO2 100%   BMI 30.18 kg/m   Interview today: Today the patient states he is good! Reports he is feeling much better and thinks that the groups here have helped a lot. He has not had any suicidal thoughts for a few days. Sleeping well and with good appetite and energy. No additional concerns voiced today. Denying SI, HI and AVH. Patient voiced concern that he needs to work tomorrow evening and is hoping to go back to his group home tomorrow.    Past Medical History:  Past Medical History:  Diagnosis Date   ADHD (attention deficit hyperactivity disorder)    Allergy     Angio-edema    Anxiety    Asthma    Attention deficit disorder (ADD), child, with hyperactivity    Autism    Eczema    Food allergy     OCD (obsessive compulsive disorder)    Scoliosis    Urticaria     Past Surgical History:  Procedure Laterality Date   ADENOIDECTOMY     ADENOIDECTOMY     SPINAL FUSION     03/2021 per mother   TYMPANOSTOMY TUBE PLACEMENT     Family History:  Family History  Problem Relation Age of Onset   Myocarditis Brother        14 months   Rheum arthritis Maternal Aunt    Stroke Maternal Grandmother    Diabetes type II Maternal Grandmother    Heart disease Maternal Grandfather    Bowel Disease Maternal Grandfather    Cancer Neg Hx    Heart failure Neg Hx    Hyperlipidemia  Neg Hx    Hypertension Neg Hx    Family Psychiatric  History: denies  Social History:  Social History   Substance and Sexual Activity  Alcohol Use No     Social History   Substance and Sexual Activity  Drug Use No    Social History   Socioeconomic History   Marital status: Single    Spouse name: Not on file   Number of children: Not on file   Years of education: Not on file   Highest education level: Not on file  Occupational History   Occupation: Consulting Civil Engineer  Tobacco Use   Smoking status: Never   Smokeless tobacco: Never  Vaping Use   Vaping  status: Never Used  Substance and Sexual Activity   Alcohol use: No   Drug use: No   Sexual activity: Not Currently  Other Topics Concern   Not on file  Social History Narrative   Izzak is an 11th grade student.   He attends [employer].   He lives with his mom only.   He enjoys engineer, site movies (his favorite character is Academic Librarian), video games and youtube, and hanging out with his friends.    Social Drivers of Health   Tobacco Use: Low Risk (08/08/2024)   Patient History    Smoking Tobacco Use: Never    Smokeless Tobacco Use: Never    Passive Exposure: Not on file  Financial Resource Strain: Not on file  Food Insecurity: No Food Insecurity (08/08/2024)   Epic    Worried About Programme Researcher, Broadcasting/film/video in the Last Year: Never true    Ran Out of Food in the Last Year: Never true  Transportation Needs: No Transportation Needs (08/08/2024)   Epic    Lack of Transportation (Medical): No    Lack of Transportation (Non-Medical): No  Physical Activity: Not on file  Stress: Not on file  Social Connections: Unknown (08/08/2024)   Social Connection and Isolation Panel    Frequency of Communication with Friends and Family: Never    Frequency of Social Gatherings with Friends and Family: Not on file    Attends Religious Services: Never    Active Member of Clubs or Organizations: No    Attends Banker Meetings: Never    Marital Status: Never married  Depression (PHQ2-9): Low Risk (11/12/2023)   Depression (PHQ2-9)    PHQ-2 Score: 0  Alcohol Screen: Low Risk (08/08/2024)   Alcohol Screen    Last Alcohol Screening Score (AUDIT): 0  Housing: Low Risk (08/08/2024)   Epic    Unable to Pay for Housing in the Last Year: No    Number of Times Moved in the Last Year: 0    Homeless in the Last Year: No  Utilities: Not At Risk (08/08/2024)   Epic    Threatened with loss of utilities: No  Health Literacy: Not on file     Current Medications: Current  Facility-Administered Medications  Medication Dose Route Frequency Provider Last Rate Last Admin   acetaminophen  (TYLENOL ) tablet 650 mg  650 mg Oral Q6H PRN Hobson, Fran E, NP       albuterol  (VENTOLIN  HFA) 108 (90 Base) MCG/ACT inhaler 2 puff  2 puff Inhalation Q4H PRN Ntuen, Tina C, FNP       alum & mag hydroxide-simeth (MAALOX/MYLANTA) 200-200-20 MG/5ML suspension 30 mL  30 mL Oral Q4H PRN Hobson, Fran E, NP       cloNIDine  (CATAPRES ) tablet 0.1 mg  0.1  mg Oral BID Hobson, Fran E, NP   0.1 mg at 08/12/24 9272   dexmethylphenidate  (FOCALIN  XR) 24 hr capsule 25 mg  25 mg Oral Daily Hobson, Fran E, NP   25 mg at 08/12/24 9272   haloperidol  (HALDOL ) tablet 5 mg  5 mg Oral TID PRN Hobson, Fran E, NP   5 mg at 08/10/24 0255   And   diphenhydrAMINE  (BENADRYL ) capsule 50 mg  50 mg Oral TID PRN Hobson, Fran E, NP   50 mg at 08/10/24 0255   haloperidol  lactate (HALDOL ) injection 5 mg  5 mg Intramuscular TID PRN Hobson, Fran E, NP       And   diphenhydrAMINE  (BENADRYL ) injection 50 mg  50 mg Intramuscular TID PRN Hobson, Fran E, NP       And   LORazepam  (ATIVAN ) injection 2 mg  2 mg Intramuscular TID PRN Hobson, Fran E, NP       haloperidol  lactate (HALDOL ) injection 10 mg  10 mg Intramuscular TID PRN Hobson, Fran E, NP       And   diphenhydrAMINE  (BENADRYL ) injection 50 mg  50 mg Intramuscular TID PRN Hobson, Fran E, NP       And   LORazepam  (ATIVAN ) injection 2 mg  2 mg Intramuscular TID PRN Hobson, Fran E, NP       FLUoxetine  (PROZAC ) capsule 10 mg  10 mg Oral Daily Ntuen, Tina C, FNP   10 mg at 08/12/24 9272   fluticasone  (FLOVENT  HFA) 110 MCG/ACT inhaler 2 puff  2 puff Inhalation BID Ntuen, Tina C, FNP   2 puff at 08/11/24 1707   fluvoxaMINE  (LUVOX ) tablet 150 mg  150 mg Oral BID Hobson, Fran E, NP   150 mg at 08/12/24 9272   loratadine  (CLARITIN ) tablet 10 mg  10 mg Oral QPM Ntuen, Tina C, FNP   10 mg at 08/11/24 1708   magnesium  hydroxide (MILK OF MAGNESIA) suspension 30 mL  30 mL Oral Daily  PRN Hobson, Fran E, NP       Vitamin D  (Ergocalciferol ) (DRISDOL ) 1.25 MG (50000 UNIT) capsule 50,000 Units  50,000 Units Oral Q7 days Ntuen, Tina C, FNP   50,000 Units at 08/09/24 1556    Lab Results: No results found for this or any previous visit (from the past 48 hours).  Blood Alcohol level:  Lab Results  Component Value Date   [facility] <15 08/07/2024   ETH <10 11/08/2023    Metabolic Disorder Labs: Lab Results  Component Value Date   HGBA1C 4.5 (L) 11/08/2023   MPG 82.45 11/08/2023   MPG 91.06 09/24/2021   Lab Results  Component Value Date   PROLACTIN 25.9 11/08/2023   PROLACTIN 13.5 09/24/2021   Lab Results  Component Value Date   CHOL 161 11/08/2023   TRIG 99 11/08/2023   HDL 49 11/08/2023   CHOLHDL 3.3 11/08/2023   VLDL 20 11/08/2023   LDLCALC 92 11/08/2023   LDLCALC 58 09/24/2021    Physical Findings: AIMS:  ,  ,  ,  ,  ,  ,   CIWA:    COWS:     Mental Status exam: Appearance: black male of elevated BMI, seen reclining in bed comfortably Eye contact: fair to good  Attitude towards examiner somewhat aloof but answers questions Psychomotor: no agitation or retardation Speech: more normal in rate and prosody today Language: no delays  Mood: good  Affect: congruent, euthymic, less anxious Thought content: denying SI and HI, no delusions  expressed  Thought Process: linear and organized  Perception: denying AVH, not RTIS  Insight: fair  Judgement: fair   Orientation: x3 Attention/Concentration: good - attends to interview Memory/Cognition: grossly intact on conversation   Fund of Knowledge: Average    Musculoskeletal: Strength & Muscle Tone: within normal limits Gait & Station: normal Patient leans: N/A    Physical Exam: Physical Exam Constitutional:      Appearance: Normal appearance.  HENT:     Head: Normocephalic and atraumatic.  Pulmonary:     Effort: Pulmonary effort is normal.  Abdominal:     General: There is no distension.   Neurological:     General: No focal deficit present.     Mental Status: He is alert.    Review of Systems  All other systems reviewed and are negative.  Blood pressure 127/71, pulse 67, temperature 98.2 F (36.8 C), temperature source Oral, resp. rate 16, height 5' 6 (1.676 m), weight 84.8 kg, SpO2 100%. Body mass index is 30.18 kg/m.   Treatment Plan Summary: Daily contact with patient to assess and evaluate symptoms and progress in treatment   Assessment: The patient is a 19 y.o. male with a psychiatric history most consistent with MDD, OCD and ADHD who is admitted for increased depression and SI in the context of breakup. He has been adherent to his home medications which were continued for him unchanged, and on intake was additionally offered prozac  10 mg daily  Yesterday the patient was seen to be more alert. Was restricted in affect, anxious appearing and withdrawn. Some visible depressive signs objectively, but was denying SI. He preferred to continue prozac  for now and no medicine changes were made. Today the patient was voicing overall good mood and continuing to deny SI, more euthymic in affect. Overall appears to be at or nearing baseline. SI does appear to be specifically related to psychosocial stressor and he has received benefit with talking about breakup during groups and with peers. He has requested to leave the hospital tomorrow to go to work and unless any additional concerns arise will plan to have him go back to his group home in the morning.     DSM-5 diagnoses: Major Depressive Disorder, recurrent, severe, without psychotic features  OCD (by history) ADHD (by history)   Plan:  Legal Status: -Voluntary  Safety -q15 minute checks  -elopement, suicide and assault precautions  -daily vitals  Psychiatric Concerns  -Continue Prozac  10 mg p.o. daily for depression and anxiety.   --Continue clonidine  0.1 mg p.o. twice daily for ADHD --Continue Focalin  XR 25  mg p.o. daily for ADHD --Continue Luvox  150 mg p.o. twice daily for OCD  --Continue trazodone  tablets 50 mg p.o. q. nightly as needed for insomnia -- Continue hydroxyzine  tablet 25 mg p.o. 3 times daily as needed for anxiety  -Trazodone  50 mg at bedtime PRN for sleep -Hydroxyzine  25 mg TID PRN for anxiety -haldol  5mg  + ativan  2 mg + benadryl  50 mg PRN for agitation  Substance use concerns  -None   Nicotine Replacement  N/A; does not use nicotine products   Medical concerns Vitamin D  deficiency -continue high-dose 50 000 U weekly on Mondays  Additional PRNs: -Tylenol  tablets 650 mg every 6 hours as needed for pain -Maalox/Mylanta suspension 30 mL every 4 hours as needed for indigestion  -Milk of Magnesia 30 mL daily as needed for constipation  Labs 08/07/24 -Vitamin D  12.7 (high dose started) -CBC and CMP grossly WNL, TSH WNL -UDS and BAL  negative   EKG NSR with Qtc 403  Psychosocial interventions   -daily medication management with psychiatry -Medication education regarding risks/benefits and alternatives -bedside psychotherapy as indicated  -Patient will be encouraged to participate and engage with group therapy  -Appreciate SW assistance in coordinating safe disposition  -Anticipated LOS 3-5 days    Leita LOISE Arts, MD 08/12/2024, 7:50 AM

## 2024-08-12 NOTE — Group Note (Signed)
 Date:  08/12/2024 Time:  10:26 AM  Group Topic/Focus:  Making Healthy Choices:   The focus of this group is to help patients identify negative/unhealthy choices they were using prior to admission and identify positive/healthier coping strategies to replace them upon discharge. Wellness Toolbox:   The focus of this group is to discuss various aspects of wellness, balancing those aspects and exploring ways to increase the ability to experience wellness.  Patients will create a wellness toolbox for use upon discharge. Nutrition: How to make healthier choices.     Participation Level:  Active  Randy Lane 08/12/2024, 10:26 AM

## 2024-08-12 NOTE — Group Note (Signed)
 Date:  08/12/2024 Time:  2:34 PM  Group Topic/Focus:  Emotional Education:   The focus of this group is to discuss what feelings/emotions are, and how they are experienced. Wellness Toolbox:   The focus of this group is to discuss various aspects of wellness, balancing those aspects and exploring ways to increase the ability to experience wellness.  Patients will create a wellness toolbox for use upon discharge.    Participation Level:  Active  Randy Lane 08/12/2024, 2:34 PM

## 2024-08-12 NOTE — Group Note (Signed)
 LCSW Group Therapy Note   Group Date: 08/12/2024 Start Time: 1100 End Time: 1200   Participation:  patient was present and actively participated I the discussion.  He was insightful.  Type of Therapy:  Group Therapy  Title:  Healing Hearts:  A Safe Space for Grief  Objective:   The objective of this class, Healing Hearts:  A Safe Space for Grief, is to create a compassionate environment where participants can process their grief, explore different stages of grief, and discover ways to honor their loved ones through personal rituals.  3 Goals: Provide a safe and supportive space where participants feel comfortable sharing their feelings and experiences of grief without judgment. Educate participants about the stages of grief and emphasize that there is no right way to grieve or a fixed timeline for healing. Introduce the concept of rituals as a means to process grief, allowing individuals to honor their loved ones in a personal and meaningful way.  Summary:  In Healing Hearts: A Safe Space for Grief, we explored the unique and personal journey of grief, emphasizing that everyone experiences it differently.  We discussed the five stages of grief (denial, anger, bargaining, depression, and acceptance), with the understanding that grief is not linear.  Rituals were introduced as a way to help cope with loss, offering comfort and connection through meaningful actions such as lighting candles or taking memory walks. Participants were encouraged to express their emotions, focus on self-care, and reflect on moments of gratitude for their loved ones, recognizing that healing is a process and there is no timeline for grief.  Therapeutic Modalities: Elements of CBT: Challenge thoughts, reframe beliefs, self-compassion Elements of DBT: Mindfulness, distress tolerance, emotion regulation Supportive Therapy:  Provide validation, foster a safe and supportive group environment, normalize  grief   Gabriele Zwilling O Hikari Tripp, LCSWA 08/12/2024  12:18 PM

## 2024-08-13 DIAGNOSIS — F332 Major depressive disorder, recurrent severe without psychotic features: Principal | ICD-10-CM

## 2024-08-13 MED ORDER — VITAMIN D (ERGOCALCIFEROL) 1.25 MG (50000 UNIT) PO CAPS: 50000.0000 [IU] | ORAL_CAPSULE | ORAL | 0 refills | Status: AC

## 2024-08-13 MED ORDER — FLUOXETINE HCL 10 MG PO CAPS: 10.0000 mg | ORAL_CAPSULE | Freq: Every day | ORAL | 0 refills | Status: AC

## 2024-08-13 NOTE — Group Note (Signed)
 Date:  08/13/2024 Time:  9:10 AM  Group Topic/Focus:  Goals Group:   The focus of this group is to help patients establish daily goals to achieve during treatment and discuss how the patient can incorporate goal setting into their daily lives to aide in recovery. Orientation:   The focus of this group is to educate the patient on the purpose and policies of crisis stabilization and provide a format to answer questions about their admission.  The group details unit policies and expectations of patients while admitted.    Participation Level:  Active  Participation Quality:  Appropriate  Affect:  Appropriate  Cognitive:  Appropriate  Insight: Appropriate  Engagement in Group:  Engaged  Modes of Intervention:  Activity  Additional Comments:  Pt attended and participated.    Lacrystal Barbe 08/13/2024, 9:10 AM

## 2024-08-13 NOTE — Progress Notes (Signed)
" °   08/13/24 0800  Psych Admission Type (Psych Patients Only)  Admission Status Voluntary  Psychosocial Assessment  Patient Complaints None  Eye Contact Fair  Facial Expression Animated  Affect Anxious  Speech Soft  Interaction Assertive  Motor Activity Fidgety  Appearance/Hygiene Unremarkable  Behavior Characteristics Cooperative  Mood Pleasant  Thought Process  Coherency WDL  Content WDL  Delusions None reported or observed  Perception WDL  Hallucination None reported or observed  Judgment WDL  Confusion None  Danger to Self  Current suicidal ideation? Denies  Description of Suicide Plan No Plan  Self-Injurious Behavior No self-injurious ideation or behavior indicators observed or expressed   Agreement Not to Harm Self Yes  Description of Agreement Verbal  Danger to Others  Danger to Others None reported or observed    "

## 2024-08-13 NOTE — Progress Notes (Signed)
 Pt discharged to lobby. Pt was stable and appreciative at that time. All papers and prescriptions were given and valuables returned. Verbal understanding expressed. Denies SI/HI and A/VH. Pt given opportunity to express concerns and ask questions.

## 2024-08-13 NOTE — Progress Notes (Signed)
" °  [facility] Adult Case Management Discharge Plan :  Will you be returning to the same living situation after discharge:  Yes,  Pt returning to group home. At discharge, do you have transportation home?: Yes,  Pt will return via taxi. Do you have the ability to pay for your medications: Yes,  Pt has medical insurance.  Release of information consent forms completed and in the chart;  Patient's signature needed at discharge.  Patient to Follow up at:  Follow-up Information     [facility]. Schedule an appointment as soon as possible for a visit.   Why: You may call or go to this provider for outpatient therapy and medication management services. Contact information: [address]  [address] [place] [address] [phone]         [facility] Follow up on 08/17/2024.   Specialty: Behavioral Health Why: You are scheduled for a VIRTUAL assessment for the Partial Hospitalization Program (PHP) on Tuesday, 1/27 at 10:00am. PHP is an IN-PERSON GROUP therapy that meets Monday-Thursday from 9a-2p and Fridays from 9a-1p for an average of 2-3 weeks. This appointment will last approximately 1-1.5 hours and will be virtual via MyChart. If you need to cancel or reschedule, please call [phone] and leave a voicemail with your name, date of birth, and phone number. IN CASE OF POWER OUTAGES DUE TO SNOW/ICE, we will call you to reschedule the appointment. If you are unable to attend virtually, please call us  to let us  know Contact information: [address]  [place]  [address] [phone]                Next level of care provider has access to [facility] Link:no  Safety Planning and Suicide Prevention discussed: Yes,  Completed with mother, Malin Cervini.     Has patient been referred to the Quitline?: Patient does not use tobacco/nicotine products  Patient has been referred for addiction treatment: No known substance use disorder.  Derick JONELLE Blanch,  LCSW 08/13/2024, 7:52 AM "

## 2024-08-13 NOTE — Plan of Care (Signed)
" °  Problem: Education: Goal: Knowledge of [facility] Education information/materials will improve Outcome: Adequate for Discharge Goal: Emotional status will improve Outcome: Adequate for Discharge Goal: Mental status will improve Outcome: Adequate for Discharge Goal: Verbalization of understanding the information provided will improve Outcome: Adequate for Discharge   Problem: Activity: Goal: Interest or engagement in activities will improve Outcome: Adequate for Discharge Goal: Sleeping patterns will improve Outcome: Adequate for Discharge   Problem: Coping: Goal: Ability to verbalize frustrations and anger appropriately will improve Outcome: Adequate for Discharge Goal: Ability to demonstrate self-control will improve Outcome: Adequate for Discharge   Problem: Health Behavior/Discharge Planning: Goal: Identification of resources available to assist in meeting health care needs will improve Outcome: Adequate for Discharge Goal: Compliance with treatment plan for underlying cause of condition will improve Outcome: Adequate for Discharge   Problem: Physical Regulation: Goal: Ability to maintain clinical measurements within normal limits will improve Outcome: Adequate for Discharge   Problem: Safety: Goal: Periods of time without injury will increase Outcome: Adequate for Discharge   Problem: Education: Goal: Knowledge of [facility] Education information/materials will improve Outcome: Adequate for Discharge Goal: Emotional status will improve Outcome: Adequate for Discharge Goal: Mental status will improve Outcome: Adequate for Discharge Goal: Verbalization of understanding the information provided will improve Outcome: Adequate for Discharge   Problem: Activity: Goal: Interest or engagement in activities will improve Outcome: Adequate for Discharge Goal: Sleeping patterns will improve Outcome: Adequate for Discharge   Problem: Coping: Goal:  Ability to verbalize frustrations and anger appropriately will improve Outcome: Adequate for Discharge Goal: Ability to demonstrate self-control will improve Outcome: Adequate for Discharge   Problem: Health Behavior/Discharge Planning: Goal: Identification of resources available to assist in meeting health care needs will improve Outcome: Adequate for Discharge Goal: Compliance with treatment plan for underlying cause of condition will improve Outcome: Adequate for Discharge   Problem: Physical Regulation: Goal: Ability to maintain clinical measurements within normal limits will improve Outcome: Adequate for Discharge   Problem: Safety: Goal: Periods of time without injury will increase Outcome: Adequate for Discharge   Problem: Activity: Goal: Interest or engagement in leisure activities will improve Outcome: Adequate for Discharge Goal: Imbalance in normal sleep/wake cycle will improve Outcome: Adequate for Discharge   Problem: Coping: Goal: Coping ability will improve Outcome: Adequate for Discharge Goal: Will verbalize feelings Outcome: Adequate for Discharge   Problem: Health Behavior/Discharge Planning: Goal: Identification of resources available to assist in meeting health care needs will improve Outcome: Adequate for Discharge   "

## 2024-08-13 NOTE — Progress Notes (Signed)
 08/13/2024  Jaquelyn BROCKS Fusilier DOB: Jul 25, 2005 MRN: [id number]   RIDER WAIVER AND RELEASE OF LIABILITY  For the purposes of helping with transportation needs, [facility] partners with outside transportation providers (taxi companies, [place], catering manager.) to give [facility] patients or other approved people the choice of on-demand rides Public Librarian) to our buildings for non-emergency visits.  By using [employer], I, the person signing this document, on behalf of myself and/or any legal minors (in my care using the [employer]), agree:  Science Writer given to me are supplied by independent, outside transportation providers who do not work for, or have any affiliation with, [employer]. [place] is not a transportation company. [facility] has no control over the quality or safety of the rides I get using [employer]. [facility] has no control over whether any outside ride will happen on time or not. [facility] gives no guarantee on the reliability, quality, safety, or availability on any rides, or that no mistakes will happen. I know and accept that traveling by vehicle (car, truck, SVU, fleeta, bus, taxi, etc.) has risks of serious injuries such as disability, being paralyzed, and death. I know and agree the risk of using [employer] is mine alone, and not [employer]. [employer] are provided as is and as are available. The transportation providers are in charge for all inspections and care of the vehicles used to provide these rides. I agree not to take legal action against [facility], its agents, employees, officers, directors, representatives, insurers, attorneys, assigns, successors, subsidiaries, and affiliates at any time for any reasons related directly or indirectly to using [employer]. I also agree not to take legal action against [facility] or its affiliates for any injury, death, or damage to property caused by or related to using  [employer]. I have read this Waiver and Release of Liability, and I understand the terms used in it and their legal meaning. This Waiver is freely and voluntarily given with the understanding that my right (or any legal minors) to legal action against  relating to [employer] is knowingly given up to use these services.   I attest that I read the Ride Waiver and Release of Liability to Jaquelyn BROCKS Pass, gave Mr. Poke the opportunity to ask questions and answered the questions asked (if any). I affirm that Tyliek C Binegar then provided consent for assistance with transportation.

## 2024-08-13 NOTE — BHH Suicide Risk Assessment (Signed)
 [facility] Discharge Suicide Risk Assessment   Principal Problem: MDD (major depressive disorder), recurrent severe, without psychosis (HCC) Discharge Diagnoses: Principal Problem:   MDD (major depressive disorder), recurrent severe, without psychosis (HCC)   Suicide Risk:  Patient presented with acute risk factors for suicide including depression and SI in the context of psychosocial stressor for which he was admitted to [facility] with medication adjustments and engagement in therapeutic milieu. Chronic risk factors include young age, male gender, history of impulsivity, prior psychiatric admissions and prior suicide attempts. Chronic, non-modifiable risk factors elevate risk of suicide in comparison to the general populaiton. However, he has a history of help-seeking behaviors, good outpatient follow up and adherence to medications, employment, no significant substance use concerns, support through group home for housing, he is future oriented and help-seeking. Agreeable to PHP after this admission as noted below. Since second day of admission has denied SI and today presents as euthymic, endorsing good mood, denying SI and expressing clear future orientation. Current and short-term risk of suicide is considered low.   Follow-up Information     [facility]. Schedule an appointment as soon as possible for a visit.   Why: You may call or go to this provider for outpatient therapy and medication management services. Contact information: [address]  [address] [place] [address] [phone]         [facility] Follow up on 08/17/2024.   Specialty: Behavioral Health Why: You are scheduled for a VIRTUAL assessment for the Partial Hospitalization Program (PHP) on Tuesday, 1/27 at 10:00am. PHP is an IN-PERSON GROUP therapy that meets Monday-Thursday from 9a-2p and Fridays from 9a-1p for an average of 2-3 weeks. This appointment will last approximately 1-1.5 hours and will be  virtual via MyChart. If you need to cancel or reschedule, please call [phone] and leave a voicemail with your name, date of birth, and phone number. IN CASE OF POWER OUTAGES DUE TO SNOW/ICE, we will call you to reschedule the appointment. If you are unable to attend virtually, please call us  to let us  know Contact information: [address] [place]  [address] [phone]                 Leita LOISE Arts, MD 08/13/2024, 8:06 AM

## 2024-08-13 NOTE — Discharge Summary (Signed)
 " Physician Discharge Summary Note  Patient:  Randy Lane is an 19 y.o., male MRN:  [id number] DOB:  [birth year] Patient phone:  [phone] (home)  Patient address:   [address] [place] [address],   Total Time spent with patient:  I personally spent 35 minutes on the unit in direct patient care. The direct patient care time included face-to-face time with the patient, reviewing the patient's chart, communicating with other professionals, and coordinating care.   Date of Admission:  08/08/2024 Date of Discharge: 08/13/24  Reason for Admission:  depression, SI  Principal Problem: MDD (major depressive disorder), recurrent severe, without psychosis (HCC) Discharge Diagnoses: Principal Problem:   MDD (major depressive disorder), recurrent severe, without psychosis (HCC)   Identifying Information and Past Psychiatric History:  The patient is a 19 y.o. male (domiciled in group home, employed at [employer]) with a medical history of vitamin D  deficiency and asthma and a psychiatric history most consistent with major depressive disorder and OCD. He was admitted for increased depression and SI in the context of breakup with girlfriend.    Psychiatric history is notable for diagnoses of depression, OCD and autism spectrum disorder on chart review. Previously diagnosed with DMDD and ADHD in childhood/adolescence as well. He has 3 prior hospitalizations, all in childhood/adolescence (most recently 2023 after expressing SI and superficial cut to wrist with scissors at school -age 34) largely for depression and SI as well as emotional lability. He has reported prior suicide attempts (unclear) but none in the last 2-3 years. Previous medications include clonidine , Focalin  XR, Luvox , Prozac  and Zoloft. Currently following with Dr. Sable outpatient with home medications: focalin  XR 25 mg BID, Luvox  150 mg BID, clonidone 0.1 mg BID.   Hospital course: The patient voluntarily to [facility] from [facility] behavioral health urgent care for worsening depression, SI with plans to drown himself in the bathtub or lie on a railroad track to be ran over by a train in the context of break-up with Girlfriend of 3 months, a week ago he was admitted to [facility] to address these concerns.  On initial evaluation endorsed stigmata of depression despite adherence to home regimen. Home medications (see med list below below) were continued without changes and he was additionally started on prozac  10 mg daily to address low mood symptoms. Over the first 1-2 days of admission the patient was restricted in affect, anxious appearing and withdrawn. Some visible depressive signs objectively, but was denying SI. He preferred to continue prozac  to supplement home regimen. By 08/12/24 the patient was voicing overall good mood and continuing to deny SI, more euthymic in affect. Overall appeared to be at or nearing baseline. He requested to leave the hospital by the following morning to go to work and  given improvements plan was to monitor overnight and send home in the morning if no issues arose.   Interview today: Today the patient reports he is good! States he was sad about his breakup but feels that talking things over in groups was very helpful. He is excited for the PHP because he thinks groups are where he gets the most benefit. He denies side effects to current medication, voiced intent to continue on prozac  for now and this was sent to regular pharmacy. Reporting good sleep, appetite and energy. No SI, HI or AVH reported. Looking forward to going back to his group home today and planning to work this evening.  Behavior on unit/overall: The patient was admitted  for depression and SI in the context of breakup with girlfriend. Early in hospital course he had an episode of night yelling (commonly happens at home per patient) and received PRN olanzapine early in the morning. No  behavioral concerns and no other agitation medications were required during admission. He did not demonstrate any signs of mania or psychosis. The day following PRN administration he was very somnolent and unable to attend groups throughout the morning or engage with interview. However by the following day was alert, denying SI, and reporting some improvements with prozac  and able to attend all groups with good engagement. Collateral from mom indicated frequent SI during times of psychosocial stressors and quick return to baseline which was consistent with thi admission as the patient quickly became euthymic. SI on admission appeared to be specifically related to psychosocial stressor of breakup and he has received benefit with groups - referred to [facility] with intake on Tuesday as noted below given this. Overall patient had improvements in mood, energy, appetite and resolution of SI. He was discharged back to his group home in stable and improved condition with medication sent to pharmacy. Recommended calling 911, [phone] or presenting to nearest ED if in crisis in the future.   Past Medical History:  Past Medical History:  Diagnosis Date   ADHD (attention deficit hyperactivity disorder)    Allergy     Angio-edema    Anxiety    Asthma    Attention deficit disorder (ADD), child, with hyperactivity    Autism    Eczema    Food allergy     OCD (obsessive compulsive disorder)    Scoliosis    Urticaria     Past Surgical History:  Procedure Laterality Date   ADENOIDECTOMY     ADENOIDECTOMY     SPINAL FUSION     03/2021 per mother   TYMPANOSTOMY TUBE PLACEMENT     Family History:  Family History  Problem Relation Age of Onset   Myocarditis Brother        14 months   Rheum arthritis Maternal Aunt    Stroke Maternal Grandmother    Diabetes type II Maternal Grandmother    Heart disease Maternal Grandfather    Bowel Disease Maternal Grandfather    Cancer Neg Hx    Heart failure Neg Hx     Hyperlipidemia Neg Hx    Hypertension Neg Hx    Family Psychiatric  History: denies Social History:  Social History   Substance and Sexual Activity  Alcohol Use No     Social History   Substance and Sexual Activity  Drug Use No    Social History   Socioeconomic History   Marital status: Single    Spouse name: Not on file   Number of children: Not on file   Years of education: Not on file   Highest education level: Not on file  Occupational History   Occupation: Consulting Civil Engineer  Tobacco Use   Smoking status: Never   Smokeless tobacco: Never  Vaping Use   Vaping status: Never Used  Substance and Sexual Activity   Alcohol use: No   Drug use: No   Sexual activity: Not Currently  Other Topics Concern   Not on file  Social History Narrative   Demontrae is an 11th grade student.   He attends [employer].   He lives with his mom only.   He enjoys engineer, site movies (his favorite character is Academic Librarian), video games and youtube, and hanging out  with his friends.    Social Drivers of Health   Tobacco Use: Low Risk (08/08/2024)   Patient History    Smoking Tobacco Use: Never    Smokeless Tobacco Use: Never    Passive Exposure: Not on file  Financial Resource Strain: Not on file  Food Insecurity: No Food Insecurity (08/08/2024)   Epic    Worried About Programme Researcher, Broadcasting/film/video in the Last Year: Never true    Ran Out of Food in the Last Year: Never true  Transportation Needs: No Transportation Needs (08/08/2024)   Epic    Lack of Transportation (Medical): No    Lack of Transportation (Non-Medical): No  Physical Activity: Not on file  Stress: Not on file  Social Connections: Unknown (08/08/2024)   Social Connection and Isolation Panel    Frequency of Communication with Friends and Family: Never    Frequency of Social Gatherings with Friends and Family: Not on file    Attends Religious Services: Never    Active Member of Clubs or Organizations: No    Attends Tax Inspector Meetings: Never    Marital Status: Never married  Depression (PHQ2-9): Low Risk (11/12/2023)   Depression (PHQ2-9)    PHQ-2 Score: 0  Alcohol Screen: Low Risk (08/08/2024)   Alcohol Screen    Last Alcohol Screening Score (AUDIT): 0  Housing: Low Risk (08/08/2024)   Epic    Unable to Pay for Housing in the Last Year: No    Number of Times Moved in the Last Year: 0    Homeless in the Last Year: No  Utilities: Not At Risk (08/08/2024)   Epic    Threatened with loss of utilities: No  Health Literacy: Not on file      Physical Findings:  Mental Status exam: Appearance: black male of elevated BMI, appearing stated age, appropriately groomed, seen walking calmly through the milieu calmly Eye contact: fair to good  Attitude towards examiner polite, engaged Psychomotor: no agitation or retardation Speech: more normal in rate and prosody today Language: no delays  Mood: good!  Affect: congruent, euthymic, reactive and appropriate Thought content: denying SI and HI, no delusions expressed  Thought Process: linear and organized  Perception: denying AVH, not RTIS  Insight: fair  Judgement: fair    Orientation: x3 Attention/Concentration: good - attends to interview Memory/Cognition: grossly intact on conversation   Fund of Knowledge: Average      Musculoskeletal: Strength & Muscle Tone: within normal limits Gait & Station: normal Patient leans: N/A   Physical Exam Constitutional:      Appearance: Normal appearance.  HENT:     Head: Normocephalic and atraumatic.  Pulmonary:     Effort: Pulmonary effort is normal.  Musculoskeletal:        General: Normal range of motion.     Cervical back: Normal range of motion.  Neurological:     General: No focal deficit present.     Mental Status: He is alert.    Review of Systems  All other systems reviewed and are negative.  Blood pressure (!) 144/67, pulse 85, temperature 98.7 F (37.1 C), temperature source  Oral, resp. rate 16, height 5' 6 (1.676 m), weight 84.8 kg, SpO2 100%. Body mass index is 30.18 kg/m.   Tobacco Use History[1] Tobacco Cessation:  N/A, patient does not currently use tobacco products   Blood Alcohol level:  Lab Results  Component Value Date   ETH <15 08/07/2024   ETH <10 11/08/2023  Metabolic Disorder Labs:  Lab Results  Component Value Date   HGBA1C 4.5 (L) 11/08/2023   MPG 82.45 11/08/2023   MPG 91.06 09/24/2021   Lab Results  Component Value Date   PROLACTIN 25.9 11/08/2023   PROLACTIN 13.5 09/24/2021   Lab Results  Component Value Date   CHOL 161 11/08/2023   TRIG 99 11/08/2023   HDL 49 11/08/2023   CHOLHDL 3.3 11/08/2023   VLDL 20 11/08/2023   LDLCALC 92 11/08/2023   LDLCALC 58 09/24/2021    See Psychiatric Specialty Exam and Suicide Risk Assessment completed by Attending Physician prior to discharge.  Discharge destination:  Home  Is patient on multiple antipsychotic therapies at discharge:  No    Allergies as of 08/13/2024       Reactions   Peanut  Oil Anaphylaxis, Other (See Comments)   By allergy  testing only - followed by Dr. Vinie Fluke Allergy  Other (See Comments)   Per mother -unknown   Dust Mite Extract Itching   Egg Protein (egg White) Itching, Rash, Other (See Comments)   By allergy  testing   Egg Protein-containing Drug Products Itching, Rash   Other Itching, Rash, Other (See Comments)   ALL tree nuts   Shellfish Allergy  Itching, Rash, Other (See Comments)   By allergy  testing        Medication List     STOP taking these medications    acetaminophen  325 MG tablet Commonly known as: TYLENOL    budesonide -formoterol  160-4.5 MCG/ACT inhaler Commonly known as: Symbicort        TAKE these medications      Indication  albuterol  108 (90 Base) MCG/ACT inhaler Commonly known as: Ventolin  HFA Inhale 2 puffs into the lungs every 4 (four) hours as needed for wheezing or shortness of breath.  Indication: Asthma    Azelastine -Fluticasone  137-50 MCG/ACT Susp Place 1 spray into the nose daily.  Indication: Hayfever   cloNIDine  0.1 MG tablet Commonly known as: CATAPRES  Take 0.1 mg by mouth 2 (two) times daily.  Indication: anxiey, adhd   Dexmethylphenidate  HCl 25 MG Cp24 Take 1 capsule by mouth every morning.  Indication: ADHD - Attention Deficit Hyperactivity Disorder   EPINEPHrine  0.3 mg/0.3 mL Soaj injection Commonly known as: EPI-PEN Inject 0.3 mg into the muscle as needed for anaphylaxis.  Indication: Life-Threatening Hypersensitivity Reaction   FLUoxetine  10 MG capsule Commonly known as: PROZAC  Take 1 capsule (10 mg total) by mouth daily. Start taking on: August 14, 2024  Indication: Major Depressive Disorder   fluticasone  110 MCG/ACT inhaler Commonly known as: FLOVENT  HFA Inhale 2 puffs into the lungs 2 (two) times daily.  Indication: Asthma   fluvoxaMINE  100 MG tablet Commonly known as: LUVOX  Take 150 mg by mouth 2 (two) times daily.  Indication: Obsessive Compulsive Disorder   levocetirizine 5 MG tablet Commonly known as: XYZAL  TAKE ONE TABLET BY MOUTH EVERY EVENING  Indication: Hayfever   Vitamin D  (Ergocalciferol ) 1.25 MG (50000 UNIT) Caps capsule Commonly known as: DRISDOL  Take 1 capsule (50,000 Units total) by mouth every 7 (seven) days for 5 doses. Take on MONDAYS Start taking on: August 16, 2024  Indication: Vitamin D  Deficiency        Follow-up Information     [facility]. Schedule an appointment as soon as possible for a visit.   Why: You may call or go to this provider for outpatient therapy and medication management services. Contact information: [address]  [address] [place] [address] [phone]  [facility] Follow up on 08/17/2024.   Specialty: Behavioral Health Why: You are scheduled for a VIRTUAL assessment for the Partial Hospitalization Program (PHP) on Tuesday, 1/27 at 10:00am. PHP is an  IN-PERSON GROUP therapy that meets Monday-Thursday from 9a-2p and Fridays from 9a-1p for an average of 2-3 weeks. This appointment will last approximately 1-1.5 hours and will be virtual via MyChart. If you need to cancel or reschedule, please call [phone] and leave a voicemail with your name, date of birth, and phone number. IN CASE OF POWER OUTAGES DUE TO SNOW/ICE, we will call you to reschedule the appointment. If you are unable to attend virtually, please call us  to let us  know Contact information: [address] [place]  [address] [phone]                Signed: Leita LOISE Arts, MD 08/13/2024, 8:18 AM         [1]  Social History Tobacco Use  Smoking Status Never  Smokeless Tobacco Never   "

## 2024-08-17 ENCOUNTER — Encounter (HOSPITAL_COMMUNITY): Payer: Self-pay

## 2024-08-17 ENCOUNTER — Other Ambulatory Visit: Payer: Self-pay | Admitting: Internal Medicine

## 2024-08-17 ENCOUNTER — Telehealth (HOSPITAL_COMMUNITY): Payer: Self-pay | Admitting: Licensed Clinical Social Worker

## 2024-08-17 ENCOUNTER — Ambulatory Visit (HOSPITAL_COMMUNITY): Payer: MEDICAID

## 2024-08-17 NOTE — Telephone Encounter (Signed)
 Cln called pt re: virtual php intake appt. Cln monitored the virtual meeting space until 10:15 and pt did not present. Cln called to f/u and see if pt would like to reschedule or needed assistance. Call rang twice and went to VM. Cln left generic message requesting a call back to [phone]

## 2024-12-31 ENCOUNTER — Ambulatory Visit: Payer: MEDICAID | Admitting: Internal Medicine
